# Patient Record
Sex: Female | Born: 1975 | Race: Black or African American | Hispanic: No | Marital: Married | Smoking: Never smoker
Health system: Southern US, Community
[De-identification: ages and names within clinical notes are randomized; demographics above are authoritative.]

## PROBLEM LIST (undated history)

## (undated) DIAGNOSIS — K810 Acute cholecystitis: Secondary | ICD-10-CM

## (undated) DIAGNOSIS — K819 Cholecystitis, unspecified: Secondary | ICD-10-CM

## (undated) DIAGNOSIS — M549 Dorsalgia, unspecified: Secondary | ICD-10-CM

## (undated) DIAGNOSIS — R740 Nonspecific elevation of levels of transaminase and lactic acid dehydrogenase [LDH]: Secondary | ICD-10-CM

## (undated) DIAGNOSIS — I10 Essential (primary) hypertension: Secondary | ICD-10-CM

## (undated) HISTORY — DX: Cholecystitis, unspecified: K81.9

---

## 2013-02-13 ENCOUNTER — Emergency Department (HOSPITAL_BASED_OUTPATIENT_CLINIC_OR_DEPARTMENT_OTHER): Payer: BC Managed Care – PPO

## 2013-02-13 ENCOUNTER — Inpatient Hospital Stay (HOSPITAL_COMMUNITY): Payer: BC Managed Care – PPO

## 2013-02-13 ENCOUNTER — Encounter (HOSPITAL_COMMUNITY): Admission: EM | Disposition: A | Discharge status: Home / Self Care | Payer: Self-pay | Source: Home / Self Care

## 2013-02-13 ENCOUNTER — Encounter (HOSPITAL_COMMUNITY): Payer: Self-pay | Admitting: Anesthesiology

## 2013-02-13 ENCOUNTER — Encounter (HOSPITAL_BASED_OUTPATIENT_CLINIC_OR_DEPARTMENT_OTHER): Payer: Self-pay | Admitting: Emergency Medicine

## 2013-02-13 ENCOUNTER — Inpatient Hospital Stay (HOSPITAL_COMMUNITY): Payer: BC Managed Care – PPO | Admitting: Anesthesiology

## 2013-02-13 ENCOUNTER — Inpatient Hospital Stay (HOSPITAL_BASED_OUTPATIENT_CLINIC_OR_DEPARTMENT_OTHER)
Admission: EM | Admit: 2013-02-13 | Discharge: 2013-02-15 | DRG: 494 | Disposition: A | Discharge status: Home / Self Care | Payer: BC Managed Care – PPO | Attending: General Surgery | Admitting: General Surgery

## 2013-02-13 DIAGNOSIS — I10 Essential (primary) hypertension: Secondary | ICD-10-CM | POA: Diagnosis present

## 2013-02-13 DIAGNOSIS — K8000 Calculus of gallbladder with acute cholecystitis without obstruction: Principal | ICD-10-CM | POA: Diagnosis present

## 2013-02-13 DIAGNOSIS — K810 Acute cholecystitis: Secondary | ICD-10-CM | POA: Diagnosis present

## 2013-02-13 DIAGNOSIS — R945 Abnormal results of liver function studies: Secondary | ICD-10-CM

## 2013-02-13 DIAGNOSIS — K8010 Calculus of gallbladder with chronic cholecystitis without obstruction: Secondary | ICD-10-CM

## 2013-02-13 DIAGNOSIS — R7989 Other specified abnormal findings of blood chemistry: Secondary | ICD-10-CM | POA: Diagnosis present

## 2013-02-13 HISTORY — DX: Essential (primary) hypertension: I10

## 2013-02-13 HISTORY — DX: Dorsalgia, unspecified: M54.9

## 2013-02-13 HISTORY — PX: CHOLECYSTECTOMY: SHX55

## 2013-02-13 HISTORY — DX: Nonspecific elevation of levels of transaminase and lactic acid dehydrogenase (ldh): R74.0

## 2013-02-13 HISTORY — PX: LIVER BIOPSY: SHX301

## 2013-02-13 HISTORY — DX: Acute cholecystitis: K81.0

## 2013-02-13 LAB — CBC WITH DIFFERENTIAL/PLATELET
Basophils Absolute: 0 10*3/uL (ref 0.0–0.1)
Basophils Relative: 0 % (ref 0–1)
Eosinophils Absolute: 0 10*3/uL (ref 0.0–0.7)
Hemoglobin: 12.9 g/dL (ref 12.0–15.0)
Lymphocytes Relative: 16 % (ref 12–46)
MCH: 23.1 pg — ABNORMAL LOW (ref 26.0–34.0)
MCHC: 33.2 g/dL (ref 30.0–36.0)
Monocytes Absolute: 0.6 10*3/uL (ref 0.1–1.0)
Neutrophils Relative %: 75 % (ref 43–77)
Platelets: 277 10*3/uL (ref 150–400)
RDW: 14.3 % (ref 11.5–15.5)

## 2013-02-13 LAB — URINALYSIS, ROUTINE W REFLEX MICROSCOPIC
Bilirubin Urine: NEGATIVE
Glucose, UA: NEGATIVE mg/dL
Hgb urine dipstick: NEGATIVE
Protein, ur: NEGATIVE mg/dL
Specific Gravity, Urine: 1.014 (ref 1.005–1.030)
Urobilinogen, UA: 0.2 mg/dL (ref 0.0–1.0)

## 2013-02-13 LAB — COMPREHENSIVE METABOLIC PANEL
Alkaline Phosphatase: 86 U/L (ref 39–117)
BUN: 11 mg/dL (ref 6–23)
CO2: 24 mEq/L (ref 19–32)
GFR calc Af Amer: >90 mL/min (ref >90)
GFR calc non Af Amer: >90 mL/min (ref >90)
Glucose, Bld: 115 mg/dL — ABNORMAL HIGH (ref 70–99)
Potassium: 4 mEq/L (ref 3.5–5.1)
Total Protein: 7.6 g/dL (ref 6.0–8.3)

## 2013-02-13 LAB — LIPASE, BLOOD: Lipase: 40 U/L (ref 11–59)

## 2013-02-13 SURGERY — LAPAROSCOPIC CHOLECYSTECTOMY WITH INTRAOPERATIVE CHOLANGIOGRAM
Anesthesia: General | Wound class: Clean Contaminated

## 2013-02-13 MED ORDER — SODIUM CHLORIDE 0.9 % IV SOLN
3.0000 g | Freq: Four times a day (QID) | INTRAVENOUS | Status: AC
  Administered 2013-02-13: 3 g via INTRAVENOUS
  Filled 2013-02-13 (×2): qty 3

## 2013-02-13 MED ORDER — HYDROMORPHONE HCL PF 1 MG/ML IJ SOLN
INTRAMUSCULAR | Status: DC | PRN
  Administered 2013-02-13 (×2): 0.5 mg via INTRAVENOUS

## 2013-02-13 MED ORDER — NEOSTIGMINE METHYLSULFATE 1 MG/ML IJ SOLN
INTRAMUSCULAR | Status: DC | PRN
  Administered 2013-02-13: 4 mg via INTRAVENOUS

## 2013-02-13 MED ORDER — ONDANSETRON HCL 4 MG/2ML IJ SOLN
INTRAMUSCULAR | Status: DC | PRN
  Administered 2013-02-13: 4 mg via INTRAVENOUS

## 2013-02-13 MED ORDER — HYDROMORPHONE HCL PF 1 MG/ML IJ SOLN
0.5000 mg | INTRAMUSCULAR | Status: DC | PRN
  Administered 2013-02-13: 1 mg via INTRAVENOUS
  Filled 2013-02-13 (×2): qty 1

## 2013-02-13 MED ORDER — HEMOSTATIC AGENTS (NO CHARGE) OPTIME
TOPICAL | Status: DC | PRN
  Administered 2013-02-13: 1 via TOPICAL

## 2013-02-13 MED ORDER — FENTANYL CITRATE 0.05 MG/ML IJ SOLN
50.0000 ug | Freq: Once | INTRAMUSCULAR | Status: AC
  Administered 2013-02-13: 50 ug via INTRAVENOUS
  Filled 2013-02-13: qty 2

## 2013-02-13 MED ORDER — IOHEXOL 300 MG/ML  SOLN
100.0000 mL | Freq: Once | INTRAMUSCULAR | Status: AC | PRN
  Administered 2013-02-13: 100 mL via INTRAVENOUS

## 2013-02-13 MED ORDER — ROCURONIUM BROMIDE 100 MG/10ML IV SOLN
INTRAVENOUS | Status: DC | PRN
  Administered 2013-02-13: 10 mg via INTRAVENOUS
  Administered 2013-02-13: 30 mg via INTRAVENOUS
  Administered 2013-02-13: 10 mg via INTRAVENOUS

## 2013-02-13 MED ORDER — IOHEXOL 300 MG/ML  SOLN
INTRAMUSCULAR | Status: DC | PRN
  Administered 2013-02-13: 50 mL

## 2013-02-13 MED ORDER — GLYCOPYRROLATE 0.2 MG/ML IJ SOLN
INTRAMUSCULAR | Status: DC | PRN
  Administered 2013-02-13: .7 mg via INTRAVENOUS

## 2013-02-13 MED ORDER — LACTATED RINGERS IV SOLN
INTRAVENOUS | Status: DC
  Administered 2013-02-13: 1000 mL via INTRAVENOUS

## 2013-02-13 MED ORDER — MIDAZOLAM HCL 5 MG/5ML IJ SOLN
INTRAMUSCULAR | Status: DC | PRN
  Administered 2013-02-13: 2 mg via INTRAVENOUS

## 2013-02-13 MED ORDER — 0.9 % SODIUM CHLORIDE (POUR BTL) OPTIME
TOPICAL | Status: DC | PRN
  Administered 2013-02-13: 1000 mL

## 2013-02-13 MED ORDER — PROMETHAZINE HCL 25 MG/ML IJ SOLN: 6.2500 mg | INTRAMUSCULAR | Status: DC | PRN

## 2013-02-13 MED ORDER — LIDOCAINE HCL (CARDIAC) 20 MG/ML IV SOLN
INTRAVENOUS | Status: DC | PRN
  Administered 2013-02-13: 50 mg via INTRAVENOUS

## 2013-02-13 MED ORDER — PANTOPRAZOLE SODIUM 40 MG IV SOLR
40.0000 mg | Freq: Once | INTRAVENOUS | Status: AC
  Administered 2013-02-13: 40 mg via INTRAVENOUS
  Filled 2013-02-13: qty 40

## 2013-02-13 MED ORDER — PIPERACILLIN-TAZOBACTAM 3.375 G IVPB
3.3750 g | Freq: Three times a day (TID) | INTRAVENOUS | Status: DC
  Filled 2013-02-13 (×2): qty 50

## 2013-02-13 MED ORDER — KETOROLAC TROMETHAMINE 30 MG/ML IJ SOLN
15.0000 mg | Freq: Once | INTRAMUSCULAR | Status: AC | PRN
  Administered 2013-02-13: 30 mg via INTRAVENOUS

## 2013-02-13 MED ORDER — LABETALOL HCL 5 MG/ML IV SOLN
INTRAVENOUS | Status: DC | PRN
  Administered 2013-02-13 (×2): 5 mg via INTRAVENOUS

## 2013-02-13 MED ORDER — DIPHENHYDRAMINE HCL 12.5 MG/5ML PO ELIX: 12.5000 mg | ORAL_SOLUTION | Freq: Four times a day (QID) | ORAL | Status: DC | PRN

## 2013-02-13 MED ORDER — PROMETHAZINE HCL 25 MG PO TABS: 25.0000 mg | ORAL_TABLET | Freq: Four times a day (QID) | ORAL | Status: DC | PRN

## 2013-02-13 MED ORDER — PROMETHAZINE HCL 25 MG RE SUPP: 25.0000 mg | Freq: Four times a day (QID) | RECTAL | Status: DC | PRN

## 2013-02-13 MED ORDER — SODIUM CHLORIDE 0.9 % IV SOLN
Freq: Once | INTRAVENOUS | Status: AC
  Administered 2013-02-13: 04:00:00 via INTRAVENOUS

## 2013-02-13 MED ORDER — IOHEXOL 300 MG/ML  SOLN
50.0000 mL | Freq: Once | INTRAMUSCULAR | Status: AC | PRN
  Administered 2013-02-13: 50 mL via ORAL

## 2013-02-13 MED ORDER — PROPOFOL 10 MG/ML IV BOLUS
INTRAVENOUS | Status: DC | PRN
  Administered 2013-02-13: 150 mg via INTRAVENOUS

## 2013-02-13 MED ORDER — BUPIVACAINE HCL 0.5 % IJ SOLN
INTRAMUSCULAR | Status: DC | PRN
  Administered 2013-02-13: 10 mL

## 2013-02-13 MED ORDER — FENTANYL CITRATE 0.05 MG/ML IJ SOLN
INTRAMUSCULAR | Status: DC | PRN
  Administered 2013-02-13: 100 ug via INTRAVENOUS
  Administered 2013-02-13 (×3): 50 ug via INTRAVENOUS

## 2013-02-13 MED ORDER — DEXAMETHASONE SODIUM PHOSPHATE 10 MG/ML IJ SOLN
INTRAMUSCULAR | Status: DC | PRN
  Administered 2013-02-13: 10 mg via INTRAVENOUS

## 2013-02-13 MED ORDER — OXYCODONE HCL 5 MG PO TABS
5.0000 mg | ORAL_TABLET | ORAL | Status: DC | PRN
  Administered 2013-02-14 (×4): 10 mg via ORAL
  Administered 2013-02-14: 5 mg via ORAL
  Administered 2013-02-14 – 2013-02-15 (×2): 10 mg via ORAL
  Filled 2013-02-13: qty 1
  Filled 2013-02-13 (×6): qty 2

## 2013-02-13 MED ORDER — PROMETHAZINE HCL 25 MG/ML IJ SOLN: 25.0000 mg | Freq: Four times a day (QID) | INTRAMUSCULAR | Status: DC | PRN

## 2013-02-13 MED ORDER — DIPHENHYDRAMINE HCL 50 MG/ML IJ SOLN: 12.5000 mg | Freq: Four times a day (QID) | INTRAMUSCULAR | Status: DC | PRN

## 2013-02-13 MED ORDER — ONDANSETRON HCL 4 MG/2ML IJ SOLN: 4.0000 mg | Freq: Four times a day (QID) | INTRAMUSCULAR | Status: DC | PRN

## 2013-02-13 MED ORDER — GI COCKTAIL ~~LOC~~
30.0000 mL | Freq: Once | ORAL | Status: AC
  Administered 2013-02-13: 30 mL via ORAL
  Filled 2013-02-13: qty 30

## 2013-02-13 MED ORDER — HYDROMORPHONE HCL PF 1 MG/ML IJ SOLN: 0.2500 mg | INTRAMUSCULAR | Status: DC | PRN

## 2013-02-13 MED ORDER — POTASSIUM CHLORIDE IN NACL 20-0.9 MEQ/L-% IV SOLN
INTRAVENOUS | Status: DC
  Administered 2013-02-13 – 2013-02-15 (×4): via INTRAVENOUS
  Filled 2013-02-13 (×5): qty 1000

## 2013-02-13 MED ORDER — SODIUM CHLORIDE 0.9 % IV SOLN
3.0000 g | Freq: Once | INTRAVENOUS | Status: AC
  Administered 2013-02-13: 3 g via INTRAVENOUS
  Filled 2013-02-13: qty 3

## 2013-02-13 MED ORDER — LACTATED RINGERS IV SOLN
INTRAVENOUS | Status: DC | PRN
  Administered 2013-02-13: 1000 mL

## 2013-02-13 SURGICAL SUPPLY — 45 items
APPLIER CLIP 5 13 M/L LIGAMAX5 (MISCELLANEOUS) ×3
APPLIER CLIP ROT 10 11.4 M/L (STAPLE)
BENZOIN TINCTURE PRP APPL 2/3 (GAUZE/BANDAGES/DRESSINGS) ×3 IMPLANT
CANISTER SUCTION 2500CC (MISCELLANEOUS) ×3 IMPLANT
CHLORAPREP W/TINT 26ML (MISCELLANEOUS) ×3 IMPLANT
CLIP APPLIE 5 13 M/L LIGAMAX5 (MISCELLANEOUS) ×2 IMPLANT
CLIP APPLIE ROT 10 11.4 M/L (STAPLE) IMPLANT
CLOTH BEACON ORANGE TIMEOUT ST (SAFETY) ×3 IMPLANT
COVER MAYO STAND STRL (DRAPES) IMPLANT
COVER SURGICAL LIGHT HANDLE (MISCELLANEOUS) IMPLANT
DECANTER SPIKE VIAL GLASS SM (MISCELLANEOUS) ×3 IMPLANT
DRAPE C-ARM 42X72 X-RAY (DRAPES) IMPLANT
DRAPE LAPAROSCOPIC ABDOMINAL (DRAPES) ×3 IMPLANT
DRAPE UTILITY XL STRL (DRAPES) ×3 IMPLANT
DRSG TEGADERM 2-3/8X2-3/4 SM (GAUZE/BANDAGES/DRESSINGS) ×3 IMPLANT
ELECT REM PT RETURN 9FT ADLT (ELECTROSURGICAL) ×3
ELECTRODE REM PT RTRN 9FT ADLT (ELECTROSURGICAL) ×2 IMPLANT
ENDOLOOP SUT PDS II  0 18 (SUTURE)
ENDOLOOP SUT PDS II 0 18 (SUTURE) IMPLANT
GAUZE SPONGE 2X2 8PLY STRL LF (GAUZE/BANDAGES/DRESSINGS) ×2 IMPLANT
GLOVE BIOGEL PI IND STRL 7.0 (GLOVE) ×2 IMPLANT
GLOVE BIOGEL PI INDICATOR 7.0 (GLOVE) ×1
GLOVE ECLIPSE 8.0 STRL XLNG CF (GLOVE) ×3 IMPLANT
GLOVE INDICATOR 8.0 STRL GRN (GLOVE) ×6 IMPLANT
GOWN STRL NON-REIN LRG LVL3 (GOWN DISPOSABLE) ×3 IMPLANT
GOWN STRL REIN XL XLG (GOWN DISPOSABLE) ×6 IMPLANT
HEMOSTAT SNOW SURGICEL 2X4 (HEMOSTASIS) ×3 IMPLANT
HEMOSTAT SURGICEL 4X8 (HEMOSTASIS) IMPLANT
IV CATH 14GX2 1/4 (CATHETERS) IMPLANT
KIT BASIN OR (CUSTOM PROCEDURE TRAY) ×3 IMPLANT
NS IRRIG 1000ML POUR BTL (IV SOLUTION) IMPLANT
POUCH SPECIMEN RETRIEVAL 10MM (ENDOMECHANICALS) IMPLANT
SCISSORS LAP 5X35 DISP (ENDOMECHANICALS) ×3 IMPLANT
SET CHOLANGIOGRAPH MIX (MISCELLANEOUS) ×3 IMPLANT
SET IRRIG TUBING LAPAROSCOPIC (IRRIGATION / IRRIGATOR) ×3 IMPLANT
SOLUTION ANTI FOG 6CC (MISCELLANEOUS) ×3 IMPLANT
SPONGE GAUZE 2X2 STER 10/PKG (GAUZE/BANDAGES/DRESSINGS) ×1
STRIP CLOSURE SKIN 1/2X4 (GAUZE/BANDAGES/DRESSINGS) ×3 IMPLANT
SUT MNCRL AB 4-0 PS2 18 (SUTURE) ×3 IMPLANT
TOWEL OR 17X26 10 PK STRL BLUE (TOWEL DISPOSABLE) ×3 IMPLANT
TRAY LAP CHOLE (CUSTOM PROCEDURE TRAY) ×3 IMPLANT
TROCAR BLADELESS OPT 5 75 (ENDOMECHANICALS) ×9 IMPLANT
TROCAR XCEL BLUNT TIP 100MML (ENDOMECHANICALS) ×3 IMPLANT
TROCAR XCEL NON-BLD 11X100MML (ENDOMECHANICALS) IMPLANT
TUBING INSUFFLATION 10FT LAP (TUBING) ×3 IMPLANT

## 2013-02-13 NOTE — ED Notes (Signed)
Unasyn medication stopped and flushed from line with 10 mL normal saline before administration of Zofran. Zofran flushed from the line with 10 mL normal saline before Unasyn was restarted.

## 2013-02-13 NOTE — Anesthesia Postprocedure Evaluation (Signed)
  Anesthesia Post-op Note  Patient: Stacy Harrell  Procedure(s) Performed: Procedure(s): LAPAROSCOPIC CHOLECYSTECTOMY WITH INTRAOPERATIVE CHOLANGIOGRAM (N/A) LIVER BIOPSY  Patient Location: PACU  Anesthesia Type:General  Level of Consciousness: awake, alert  and oriented  Airway and Oxygen Therapy: Patient Spontanous Breathing and Patient connected to nasal cannula oxygen  Post-op Pain: none  Post-op Assessment: Post-op Vital signs reviewed, Patient's Cardiovascular Status Stable, Respiratory Function Stable, Patent Airway, No signs of Nausea or vomiting and Pain level controlled  Post-op Vital Signs: Reviewed and stable  Complications: No apparent anesthesia complications

## 2013-02-13 NOTE — Anesthesia Preprocedure Evaluation (Signed)
Anesthesia Evaluation  Patient identified by MRN, date of birth, ID band Patient awake    Reviewed: Allergy & Precautions, H&P , NPO status , Patient's Chart, lab work & pertinent test results  Airway Mallampati: II  TM Distance: >3 FB Neck ROM: Full    Dental no notable dental hx.    Pulmonary neg pulmonary ROS,  breath sounds clear to auscultation  Pulmonary exam normal       Cardiovascular hypertension, Pt. on medications Rhythm:Regular Rate:Normal     Neuro/Psych negative neurological ROS  negative psych ROS   GI/Hepatic negative GI ROS, Neg liver ROS,   Endo/Other  negative endocrine ROS  Renal/GU negative Renal ROS  negative genitourinary   Musculoskeletal negative musculoskeletal ROS (+)   Abdominal   Peds negative pediatric ROS (+)  Hematology negative hematology ROS (+)   Anesthesia Other Findings   Reproductive/Obstetrics negative OB ROS                            Anesthesia Physical Anesthesia Plan  ASA: II  Anesthesia Plan: General   Post-op Pain Management:    Induction: Intravenous  Airway Management Planned: Oral ETT  Additional Equipment:   Intra-op Plan:   Post-operative Plan: Extubation in OR  Informed Consent: I have reviewed the patients History and Physical, chart, labs and discussed the procedure including the risks, benefits and alternatives for the proposed anesthesia with the patient or authorized representative who has indicated his/her understanding and acceptance.   Dental advisory given  Plan Discussed with: CRNA and Surgeon  Anesthesia Plan Comments:        Anesthesia Quick Evaluation  

## 2013-02-13 NOTE — ED Provider Notes (Addendum)
History     CSN: [id number]  Arrival date & time 02/13/13  0028   First MD Initiated Contact with Patient 02/13/13 [phone]      Chief Complaint  Patient presents with  . Abdominal Pain    (Consider location/radiation/quality/duration/timing/severity/associated sxs/prior treatment) HPI This is a 37 year old female who developed subxiphoid pain at about 11 PM yesterday evening as she was preparing for bed. She states the pain feels like a tightness as if a band or rope as being tight around her. It radiates to the left into the right around to the back. It is moderate to severe. It is worse with palpation. It is not worse with deep breathing. She is not short of breath. She is not nauseated or having vomiting or diarrhea. She denies a history of abdominal surgery. She denies a history of abdominal trauma.  Past Medical History  Diagnosis Date  . Back pain     History reviewed. No pertinent past surgical history.  No family history on file.  History  Substance Use Topics  . Smoking status: Never Smoker   . Smokeless tobacco: Not on file  . Alcohol Use: No    OB History   Grav Para Term Preterm Abortions TAB SAB Ect Mult Living                  Review of Systems  All other systems reviewed and are negative.    Allergies  Review of patient's allergies indicates no known allergies.  Home Medications   Current Outpatient Rx  Name  Route  Sig  Dispense  Refill  . methocarbamol (ROBAXIN) 500 MG tablet   Oral   Take 500 mg by mouth 4 (four) times daily.         Marland Kitchen NIFEdipine (PROCARDIA-XL/ADALAT-CC/NIFEDICAL-XL) 30 MG 24 hr tablet   Oral   Take 30 mg by mouth daily.           BP 152/86  Pulse 74  Temp(Src) 98.1 F (36.7 C) (Oral)  Resp 18  SpO2 100%  LMP 01/18/2013  Physical Exam General: Well-developed, well-nourished female in no acute distress; appearance consistent with age of record HENT: normocephalic, atraumatic Eyes: pupils equal round and  reactive to light; extraocular muscles intact Neck: supple Heart: regular rate and rhythm; no murmurs, rubs or gallops Lungs: clear to auscultation bilaterally Chest: Xiphoid tenderness Abdomen: soft; nondistended; mild epigastric and right upper quadrant tenderness; no masses or hepatosplenomegaly; bowel sounds present; gallstones seen on bedside ultrasound Extremities: No deformity; full range of motion Neurologic: Awake, alert; motor function intact in all extremities and symmetric; no facial droop Skin: Warm and dry Psychiatric: Tearful    ED Course  Procedures (including critical care time)    MDM   Nursing notes and vitals signs, including pulse oximetry, reviewed.  Summary of this visit's results, reviewed by myself:  Labs:  Results for orders placed during the hospital encounter of 02/13/13 (from the past 24 hour(s))  PREGNANCY, URINE     Status: None   Collection Time    02/13/13 12:41 AM      Result Value Range   Preg Test, Ur NEGATIVE  NEGATIVE  URINALYSIS, ROUTINE W REFLEX MICROSCOPIC     Status: None   Collection Time    02/13/13 12:41 AM      Result Value Range   Color, Urine YELLOW  YELLOW   APPearance CLEAR  CLEAR   Specific Gravity, Urine 1.014  1.005 - 1.030   pH 6.0  5.0 - 8.0   Glucose, UA NEGATIVE  NEGATIVE mg/dL   Hgb urine dipstick NEGATIVE  NEGATIVE   Bilirubin Urine NEGATIVE  NEGATIVE   Ketones, ur NEGATIVE  NEGATIVE mg/dL   Protein, ur NEGATIVE  NEGATIVE mg/dL   Urobilinogen, UA 0.2  0.0 - 1.0 mg/dL   Nitrite NEGATIVE  NEGATIVE   Leukocytes, UA NEGATIVE  NEGATIVE  COMPREHENSIVE METABOLIC PANEL     Status: Abnormal   Collection Time    02/13/13  3:47 AM      Result Value Range   Sodium 138  135 - 145 mEq/L   Potassium 4.0  3.5 - 5.1 mEq/L   Chloride 103  96 - 112 mEq/L   CO2 24  19 - 32 mEq/L   Glucose, Bld 115 (*) 70 - 99 mg/dL   BUN 11  6 - 23 mg/dL   Creatinine, Ser 4.09  0.50 - 1.10 mg/dL   Calcium 9.2  8.4 - 81.1 mg/dL   Total  Protein 7.6  6.0 - 8.3 g/dL   Albumin 3.7  3.5 - 5.2 g/dL   AST 914 (*) 0 - 37 U/L   ALT 338 (*) 0 - 35 U/L   Alkaline Phosphatase 86  39 - 117 U/L   Total Bilirubin 0.6  0.3 - 1.2 mg/dL   GFR calc non Af Amer >90  >90 mL/min   GFR calc Af Amer >90  >90 mL/min  LIPASE, BLOOD     Status: None   Collection Time    02/13/13  3:47 AM      Result Value Range   Lipase 40  11 - 59 U/L  CBC WITH DIFFERENTIAL     Status: Abnormal   Collection Time    02/13/13  3:47 AM      Result Value Range   WBC 6.3  4.0 - 10.5 K/uL   RBC 5.59 (*) 3.87 - 5.11 MIL/uL   Hemoglobin 12.9  12.0 - 15.0 g/dL   HCT 78.2  95.6 - 21.3 %   MCV 69.4 (*) 78.0 - 100.0 fL   MCH 23.1 (*) 26.0 - 34.0 pg   MCHC 33.2  30.0 - 36.0 g/dL   RDW 08.6  57.8 - 46.9 %   Platelets 277  150 - 400 K/uL   Neutrophils Relative 75  43 - 77 %   Lymphocytes Relative 16  12 - 46 %   Monocytes Relative 9  3 - 12 %   Eosinophils Relative 0  0 - 5 %   Basophils Relative 0  0 - 1 %   Neutro Abs 4.7  1.7 - 7.7 K/uL   Lymphs Abs 1.0  0.7 - 4.0 K/uL   Monocytes Absolute 0.6  0.1 - 1.0 K/uL   Eosinophils Absolute 0.0  0.0 - 0.7 K/uL   Basophils Absolute 0.0  0.0 - 0.1 K/uL   RBC Morphology ELLIPTOCYTES     WBC Morphology WHITE COUNT CONFIRMED ON SMEAR     Smear Review PLATELET COUNT CONFIRMED BY SMEAR      Imaging Studies: Dg Chest 2 View  02/13/2013  *RADIOLOGY REPORT*  Clinical Data: Mid chest pain and epigastric pain; shortness of breath.  CHEST - 2 VIEW  Comparison: None.  Findings: The lungs are well-aerated and clear.  There is no evidence of focal opacification, pleural effusion or pneumothorax.  The heart is borderline normal in size; the mediastinal contour is within normal limits.  No acute osseous abnormalities are seen.  IMPRESSION: No acute cardiopulmonary process seen.   Original Report Authenticated By: Tonia Ghent, M.D.    US Abdomen Complete  02/13/2013  *RADIOLOGY REPORT*  Clinical Data:  Chest pain and abnormal  gallbladder on recent abdominal CT.  COMPLETE ABDOMINAL ULTRASOUND  Comparison:  Abdominal CT 02/13/2013  Findings:  Gallbladder:  There are multiple echogenic stones in the gallbladder.  The gallbladder wall is thickened, measuring up to 9 mm and there may be edema within the gallbladder wall.  The patient does have a sonographic Murphy's sign.  Many of the echogenic gallstones demonstrate posterior acoustic shadowing.  Common bile duct:  Common bile duct measures 0.3 cm.  Liver:  No focal lesion identified.  Within normal limits in parenchymal echogenicity.  IVC:  Appears normal.  Pancreas:  Pancreatic tail is not visualized due to bowel gas.  Spleen:  Spleen length is 6.2 cm.  No gross abnormality to the spleen.  Right Kidney:  Right kidney is difficult to evaluate due to shadowing.  No evidence for hydronephrosis.  Left Kidney:  Left kidney measures 10.1 cm in length without hydronephrosis.  Abdominal aorta:  The distal and mid abdominal aorta are difficult to evaluate due to bowel gas.  IMPRESSION: Cholelithiasis with gallbladder wall thickening and a sonographic Murphy's sign.  Findings are suggestive for acute cholecystitis.  No evidence for biliary dilatation.  Many of the intra-abdominal structures are poorly visualized due to bowel gas.   Original Report Authenticated By: Richarda Overlie, M.D.    Ct Abdomen Pelvis W Contrast  02/13/2013  *RADIOLOGY REPORT*  Clinical Data: Epigastric abdominal pain.  CT ABDOMEN AND PELVIS WITH CONTRAST  Technique:  Multidetector CT imaging of the abdomen and pelvis was performed following the standard protocol during bolus administration of intravenous contrast.  Contrast: 100 mL of Omnipaque 300 IV contrast  Comparison: None.  Findings: The visualized lung bases are clear.  There is mild soft tissue inflammation about the gallbladder, with mild apparent gallbladder wall thickening.  Mildly increased attenuation layering within the gallbladder may reflect sludge.  The liver is  unremarkable in appearance.  A small amount of high- attenuation fluid is noted surrounding the spleen, which could reflect trace blood.  Would correlate for evidence of recent traumatic injury.  The pancreas and adrenal glands are unremarkable in appearance.  The kidneys are unremarkable in appearance.  There is no evidence of hydronephrosis.  No renal or ureteral stones are seen.  No perinephric stranding is appreciated.  No free fluid is identified.  The small bowel is unremarkable in appearance.  The stomach is within normal limits.  No acute vascular abnormalities are seen.  The appendix is normal in caliber and contains air, without evidence for appendicitis.  The colon is unremarkable in appearance.  The bladder is moderately distended and grossly unremarkable in appearance.  The uterus is grossly unremarkable, though difficult to fully assess.  The ovaries are relatively symmetric; no suspicious adnexal masses are seen.  No inguinal lymphadenopathy is seen.  No acute osseous abnormalities are identified.  IMPRESSION:  1.  Mild soft tissue inflammation about the gallbladder, with mild apparent gallbladder wall thickening.  Likely sludge noted within the gallbladder.  Would correlate for evidence of cholecystitis, and consider further imaging as deemed clinically appropriate. 2.  Small amount of high-attenuation fluid noted surrounding the spleen; this is suspicious for trace blood.  Would correlate for evidence of recent traumatic injury.  This could be relatively chronic in nature.  These results were called by  telephone on 02/13/2013 at 07:07 a.m. to Dr. Brock Bad, who verbally acknowledged these results.   Original Report Authenticated By: Tonia Ghent, M.D.     EKG Interpretation:  Date & Time: 02/13/2013 3:52 AM  Rate: 74  Rhythm: normal sinus rhythm  QRS Axis: normal  Intervals: normal  ST/T Wave abnormalities: normal  Conduction Disutrbances:none  Narrative Interpretation: [facility]  Old EKG  Reviewed: none available  5:29 AM Some improvement with GI cocktail. Patient is still having significant xiphoid discomfort as well as right upper quadrant tenderness. Her transaminases are elevated. We will obtain a CT scan for further evaluation.  7:11 AM Patient feels better after fentanyl IV. In light of the patient's CT abnormalities and elevated transaminases we will obtain an ultrasound of the abdomen. Dr. Ranae Palms will follow up on this and make disposition.         Hanley Seamen, MD 02/13/13 1610  Hanley Seamen, MD 02/13/13 [phone]

## 2013-02-13 NOTE — ED Provider Notes (Signed)
U/s demonstrating acute chole. Discussed with Dr Abbey Chatters who will accept transfer to [facility]. Asks that Unasyn 3 grams be given.   Loren Racer, MD 02/13/13 [phone]

## 2013-02-13 NOTE — ED Notes (Signed)
Pt c/o epigastric pain at 2300 tonight. Denies n/v/d.

## 2013-02-13 NOTE — Op Note (Signed)
Preoperative diagnosis:   Acute cholecystitis  Postoperative diagnosis:  Same  Procedure: Laparoscopic cholecystectomy with cholangiogram.  Wedge liver biopsy.  Surgeon: Avel Peace, M.D.  Asst.:  None  Anesthesia: General  Indication:   This is a 37 year old female who developed epigastric and right upper quadrant pain that radiated toward her back along with nausea area the pain persisted. She was evaluated and noted to have gallstones with a thickened gallbladder wall and a positive Murphy sign. Her SGOT and SGPT were significantly elevated. She had a normal white blood cell count. She is brought to the operating room for cholecystectomy.  Technique: She was brought to the operating room, placed supine on the operating table, and a general anesthetic was administered. The hair on the abdominal wall was clipped as was necessary. The abdominal wall was then sterilely prepped and draped. Local anesthetic (Marcaine) was infiltrated in the subumbilical region. A small subumbilical incision was made through the skin, subcutaneous tissue, fascia, and peritoneum entering the peritoneal cavity under direct vision. A pursestring suture of 0 Vicryl was placed around the edges of the fascia. A Hassan trocar was introduced into the peritoneal cavity and a pneumoperitoneum was created by insufflation of carbon dioxide gas. The laparoscope was introduced into the trocar and no underlying bleeding or organ injury was noted. The patient was then placed in the reverse Trendelenburg position with the right side tilted slightly up.  Three 5 mm trocars were then placed into the abdominal cavity under laparoscopic vision. One in the epigastric area, and 2 in the right upper quadrant area. The gallbladder was visualized and the fundus was grasped and retracted toward the right shoulder. Mild to moderate acute inflammatory changes were noted. Significant gallbladder wall edema was noted. The infundibulum was  mobilized with dissection close to the gallbladder and retracted laterally. The cystic duct was identified and a window was created around it. The anterior branch of the cystic artery was also identified and a window was created around it. The critical view was achieved. A clip was placed at the neck of the gallbladder. A small incision was made in the cystic duct. A cholangiocatheter was introduced through the anterior abdominal wall and placed in the cystic duct. A intraoperative cholangiogram was then performed.  Under real-time fluoroscopy, dilute contrast was injected into the cystic duct.  The common hepatic duct, the right and left hepatic ducts, and the common duct were all visualized. Contrast drained into the duodenum without obvious evidence of any obstructing ductal lesion. The final report is pending the Radiologist's interpretation.  The cholangiocatheter was removed, the cystic duct was clipped 3 times on the biliary side, and then the cystic duct was divided sharply. No bile leak was noted from the cystic duct stump.  The anterior branch of the cystic artery was then clipped and divided.  The posterior branch of the cystic artery was identified, clipped, and divided.  Following this the gallbladder was dissected free from the liver using electrocautery.  Because of the findings during surgery and cholangiography did not completely explain the elevation of the liver function tests in my mind, a wedge liver biopsy was performed.   The gallbladder and liver biopsy specimen were then placed in a retrieval bag and removed from the abdominal cavity through the subumbilical incision.  The gallbladder fossa was inspected, irrigated, and bleeding was controlled with electrocautery. Inspection showed that hemostasis was adequate and there was no evidence of bile leak. A piece of Surgicel was placed in the  gallbladder fossa. The irrigation fluid was evacuated as much as possible.  The subumbilical  trocar was removed and the fascial defect was closed by tightening and tying down the pursestring suture under laparoscopic vision.  The remaining trocars were removed and the pneumoperitoneum was released. The skin incisions were closed with 4-0 Monocryl subcuticular stitches. Steri-Strips and sterile dressings were applied.  The procedure was well-tolerated without any apparent complications. The patient was taken to the recovery room in satisfactory condition.

## 2013-02-13 NOTE — Transfer of Care (Signed)
Immediate Anesthesia Transfer of Care Note  Patient: Stacy Harrell  Procedure(s) Performed: Procedure(s) (LRB): LAPAROSCOPIC CHOLECYSTECTOMY WITH INTRAOPERATIVE CHOLANGIOGRAM (N/A) LIVER BIOPSY  Patient Location: PACU  Anesthesia Type: General  Level of Consciousness: sedated, patient cooperative and responds to stimulaton  Airway & Oxygen Therapy: Patient Spontanous Breathing and Patient connected to face mask oxgen  Post-op Assessment: Report given to PACU RN and Post -op Vital signs reviewed and stable  Post vital signs: Reviewed and stable  Complications: No apparent anesthesia complications

## 2013-02-13 NOTE — H&P (Signed)
Patient seen and examined.  Agree with PA's note. Plan cholecystectomy today.  Procedure and risks discussed with her.  Questions answered.

## 2013-02-13 NOTE — H&P (Signed)
Stacy Harrell is an 37 y.o. female.   Chief Complaint: Abdominal Pain Req: Dr. Ranae Palms, ER HPI: 37 year old female who developed subxiphoid pain at about 11 PM yesterday evening as she was preparing for bed. She states the pain feels like a tightness as if a band or rope as being tight around her. It radiates to the left into the right around to the back.  It is severe at times.  She is not short of breath. She denies nausea, vomiting, diarrhea or a history of abdominal surgery.   Past Medical History  Diagnosis Date  . Back pain   . Hypertension     History reviewed. No pertinent past surgical history.  History reviewed. No pertinent family history. Social History:  reports that she has never smoked. She has never used smokeless tobacco. She reports that she does not drink alcohol or use illicit drugs.  Allergies: No Known Allergies  Medications Prior to Admission  Medication Sig Dispense Refill  . methocarbamol (ROBAXIN) 500 MG tablet Take 500 mg by mouth 4 (four) times daily.      Marland Kitchen NIFEdipine (PROCARDIA-XL/ADALAT-CC/NIFEDICAL-XL) 30 MG 24 hr tablet Take 30 mg by mouth daily.        Results for orders placed during the hospital encounter of 02/13/13 (from the past 48 hour(s))  PREGNANCY, URINE     Status: None   Collection Time    02/13/13 12:41 AM      Result Value Range   Preg Test, Ur NEGATIVE  NEGATIVE   Comment:            THE SENSITIVITY OF THIS     METHODOLOGY IS >20 mIU/mL.  URINALYSIS, ROUTINE W REFLEX MICROSCOPIC     Status: None   Collection Time    02/13/13 12:41 AM      Result Value Range   Color, Urine YELLOW  YELLOW   APPearance CLEAR  CLEAR   Specific Gravity, Urine 1.014  1.005 - 1.030   pH 6.0  5.0 - 8.0   Glucose, UA NEGATIVE  NEGATIVE mg/dL   Hgb urine dipstick NEGATIVE  NEGATIVE   Bilirubin Urine NEGATIVE  NEGATIVE   Ketones, ur NEGATIVE  NEGATIVE mg/dL   Protein, ur NEGATIVE  NEGATIVE mg/dL   Urobilinogen, UA 0.2  0.0 - 1.0 mg/dL   Nitrite  NEGATIVE  NEGATIVE   Leukocytes, UA NEGATIVE  NEGATIVE   Comment: MICROSCOPIC NOT DONE ON URINES WITH NEGATIVE PROTEIN, BLOOD, LEUKOCYTES, NITRITE, OR GLUCOSE <1000 mg/dL.  COMPREHENSIVE METABOLIC PANEL     Status: Abnormal   Collection Time    02/13/13  3:47 AM      Result Value Range   Sodium 138  135 - 145 mEq/L   Potassium 4.0  3.5 - 5.1 mEq/L   Chloride 103  96 - 112 mEq/L   CO2 24  19 - 32 mEq/L   Glucose, Bld 115 (*) 70 - 99 mg/dL   BUN 11  6 - 23 mg/dL   Creatinine, Ser 6.21  0.50 - 1.10 mg/dL   Calcium 9.2  8.4 - 30.8 mg/dL   Total Protein 7.6  6.0 - 8.3 g/dL   Albumin 3.7  3.5 - 5.2 g/dL   AST 657 (*) 0 - 37 U/L   ALT 338 (*) 0 - 35 U/L   Alkaline Phosphatase 86  39 - 117 U/L   Total Bilirubin 0.6  0.3 - 1.2 mg/dL   GFR calc non Af Amer >90  >90 mL/min  GFR calc Af Amer >90  >90 mL/min   Comment:            The eGFR has been calculated     using the CKD EPI equation.     This calculation has not been     validated in all clinical     situations.     eGFR's persistently     <90 mL/min signify     possible Chronic Kidney Disease.  LIPASE, BLOOD     Status: None   Collection Time    02/13/13  3:47 AM      Result Value Range   Lipase 40  11 - 59 U/L  CBC WITH DIFFERENTIAL     Status: Abnormal   Collection Time    02/13/13  3:47 AM      Result Value Range   WBC 6.3  4.0 - 10.5 K/uL   RBC 5.59 (*) 3.87 - 5.11 MIL/uL   Hemoglobin 12.9  12.0 - 15.0 g/dL   HCT 78.2  95.6 - 21.3 %   MCV 69.4 (*) 78.0 - 100.0 fL   MCH 23.1 (*) 26.0 - 34.0 pg   MCHC 33.2  30.0 - 36.0 g/dL   RDW 08.6  57.8 - 46.9 %   Platelets 277  150 - 400 K/uL   Neutrophils Relative 75  43 - 77 %   Lymphocytes Relative 16  12 - 46 %   Monocytes Relative 9  3 - 12 %   Eosinophils Relative 0  0 - 5 %   Basophils Relative 0  0 - 1 %   Neutro Abs 4.7  1.7 - 7.7 K/uL   Lymphs Abs 1.0  0.7 - 4.0 K/uL   Monocytes Absolute 0.6  0.1 - 1.0 K/uL   Eosinophils Absolute 0.0  0.0 - 0.7 K/uL   Basophils  Absolute 0.0  0.0 - 0.1 K/uL   RBC Morphology ELLIPTOCYTES     Comment: STOMATOCYTES   WBC Morphology Karlei Waldo COUNT CONFIRMED ON SMEAR     Smear Review PLATELET COUNT CONFIRMED BY SMEAR     Dg Chest 2 View  02/13/2013  *RADIOLOGY REPORT*  Clinical Data: Mid chest pain and epigastric pain; shortness of breath.  CHEST - 2 VIEW  Comparison: None.  Findings: The lungs are well-aerated and clear.  There is no evidence of focal opacification, pleural effusion or pneumothorax.  The heart is borderline normal in size; the mediastinal contour is within normal limits.  No acute osseous abnormalities are seen.  IMPRESSION: No acute cardiopulmonary process seen.   Original Report Authenticated By: Tonia Ghent, M.D.    US Abdomen Complete  02/13/2013  *RADIOLOGY REPORT*  Clinical Data:  Chest pain and abnormal gallbladder on recent abdominal CT.  COMPLETE ABDOMINAL ULTRASOUND  Comparison:  Abdominal CT 02/13/2013  Findings:  Gallbladder:  There are multiple echogenic stones in the gallbladder.  The gallbladder wall is thickened, measuring up to 9 mm and there may be edema within the gallbladder wall.  The patient does have a sonographic Murphy's sign.  Many of the echogenic gallstones demonstrate posterior acoustic shadowing.  Common bile duct:  Common bile duct measures 0.3 cm.  Liver:  No focal lesion identified.  Within normal limits in parenchymal echogenicity.  IVC:  Appears normal.  Pancreas:  Pancreatic tail is not visualized due to bowel gas.  Spleen:  Spleen length is 6.2 cm.  No gross abnormality to the spleen.  Right Kidney:  Right kidney is difficult  to evaluate due to shadowing.  No evidence for hydronephrosis.  Left Kidney:  Left kidney measures 10.1 cm in length without hydronephrosis.  Abdominal aorta:  The distal and mid abdominal aorta are difficult to evaluate due to bowel gas.  IMPRESSION: Cholelithiasis with gallbladder wall thickening and a sonographic Murphy's sign.  Findings are suggestive for  acute cholecystitis.  No evidence for biliary dilatation.  Many of the intra-abdominal structures are poorly visualized due to bowel gas.   Original Report Authenticated By: Richarda Overlie, M.D.    Ct Abdomen Pelvis W Contrast  02/13/2013  *RADIOLOGY REPORT*  Clinical Data: Epigastric abdominal pain.  CT ABDOMEN AND PELVIS WITH CONTRAST  Technique:  Multidetector CT imaging of the abdomen and pelvis was performed following the standard protocol during bolus administration of intravenous contrast.  Contrast: 100 mL of Omnipaque 300 IV contrast  Comparison: None.  Findings: The visualized lung bases are clear.  There is mild soft tissue inflammation about the gallbladder, with mild apparent gallbladder wall thickening.  Mildly increased attenuation layering within the gallbladder may reflect sludge.  The liver is unremarkable in appearance.  A small amount of high- attenuation fluid is noted surrounding the spleen, which could reflect trace blood.  Would correlate for evidence of recent traumatic injury.  The pancreas and adrenal glands are unremarkable in appearance.  The kidneys are unremarkable in appearance.  There is no evidence of hydronephrosis.  No renal or ureteral stones are seen.  No perinephric stranding is appreciated.  No free fluid is identified.  The small bowel is unremarkable in appearance.  The stomach is within normal limits.  No acute vascular abnormalities are seen.  The appendix is normal in caliber and contains air, without evidence for appendicitis.  The colon is unremarkable in appearance.  The bladder is moderately distended and grossly unremarkable in appearance.  The uterus is grossly unremarkable, though difficult to fully assess.  The ovaries are relatively symmetric; no suspicious adnexal masses are seen.  No inguinal lymphadenopathy is seen.  No acute osseous abnormalities are identified.  IMPRESSION:  1.  Mild soft tissue inflammation about the gallbladder, with mild apparent  gallbladder wall thickening.  Likely sludge noted within the gallbladder.  Would correlate for evidence of cholecystitis, and consider further imaging as deemed clinically appropriate. 2.  Small amount of high-attenuation fluid noted surrounding the spleen; this is suspicious for trace blood.  Would correlate for evidence of recent traumatic injury.  This could be relatively chronic in nature.  These results were called by telephone on 02/13/2013 at 07:07 a.m. to Dr. Brock Bad, who verbally acknowledged these results.   Original Report Authenticated By: Tonia Ghent, M.D.     Review of Systems  Gastrointestinal: Positive for abdominal pain. Negative for nausea, vomiting, diarrhea and constipation.  All other systems reviewed and are negative.    Blood pressure 145/91, pulse 75, temperature 97.2 F (36.2 C), temperature source Oral, resp. rate 15, height 5\' 6"  (1.676 m), last menstrual period 01/18/2013, SpO2 100.00%. Physical Exam  Constitutional: She is oriented to person, place, and time. She appears well-developed and well-nourished. No distress.  HENT:  Head: Normocephalic and atraumatic.  Eyes: Conjunctivae are normal. Pupils are equal, round, and reactive to light.  Neck: Normal range of motion. Neck supple.  Cardiovascular: Normal rate and regular rhythm.   Respiratory: Effort normal and breath sounds normal.  GI: Bowel sounds are normal. She exhibits no distension. There is tenderness (under ribs and around xypoid). There is no  rebound and no guarding.  Genitourinary:  deferred  Musculoskeletal: Normal range of motion.  Neurological: She is alert and oriented to person, place, and time.  Skin: Skin is warm and dry.  Psychiatric: She has a normal mood and affect. Her behavior is normal. Thought content normal.     Assessment/Plan 1. Acute cholecystitis: the patient will be admitted, placed on IVFs, pain meds, Unasyn, and antiemetics.  She will be consented for laparoscopic  cholecystectomy.  I have explained the procedure, risks, and aftercare of cholecystectomy.  Risks include but are not limited to bleeding, infection, wound problems, anesthesia, diarrhea, bile leak, injury to common bile duct/liver/intestine.  She seems to understand and agrees to proceed  Chevon Laufer 02/13/2013, 12:43 PM

## 2013-02-14 ENCOUNTER — Encounter (HOSPITAL_COMMUNITY): Payer: Self-pay | Admitting: General Surgery

## 2013-02-14 DIAGNOSIS — K810 Acute cholecystitis: Secondary | ICD-10-CM

## 2013-02-14 LAB — CBC
HCT: 37.5 % (ref 36.0–46.0)
MCH: 22.4 pg — ABNORMAL LOW (ref 26.0–34.0)
MCHC: 31.7 g/dL (ref 30.0–36.0)
Platelets: 200 10*3/uL (ref 150–400)
RDW: 14.3 % (ref 11.5–15.5)
WBC: 6.6 10*3/uL (ref 4.0–10.5)

## 2013-02-14 LAB — COMPREHENSIVE METABOLIC PANEL
Alkaline Phosphatase: 141 U/L — ABNORMAL HIGH (ref 39–117)
BUN: 7 mg/dL (ref 6–23)
GFR calc Af Amer: >90 mL/min (ref >90)
Glucose, Bld: 135 mg/dL — ABNORMAL HIGH (ref 70–99)
Potassium: 4.1 mEq/L (ref 3.5–5.1)
Total Protein: 6.8 g/dL (ref 6.0–8.3)

## 2013-02-14 LAB — BILIRUBIN, DIRECT: Bilirubin, Direct: 0.5 mg/dL — ABNORMAL HIGH (ref 0.0–0.3)

## 2013-02-14 MED FILL — Fentanyl Citrate Inj 0.05 MG/ML: INTRAMUSCULAR | Qty: 2 | Status: AC

## 2013-02-14 NOTE — Care Management Note (Signed)
    Page 1 of 1   02/14/2013     10:39:17 AM   CARE MANAGEMENT NOTE 02/14/2013  Patient:  Stacy Harrell, Stacy Harrell   Account Number:  [id number]  Date Initiated:  02/14/2013  Documentation initiated by:  Lorenda Ishihara  Subjective/Objective Assessment:   37 yo female admitted s/p lap chole with liver bx. PTA lived at home with spouse.     Action/Plan:   Home when stable   Anticipated DC Date:  02/16/2013   Anticipated DC Plan:  HOME/SELF CARE      DC Planning Services  CM consult      Choice offered to / List presented to:             Status of service:  Completed, signed off Medicare Important Message given?   (If response is "NO", the following Medicare IM given date fields will be blank) Date Medicare IM given:   Date Additional Medicare IM given:    Discharge Disposition:  HOME/SELF CARE  Per UR Regulation:  Reviewed for med. necessity/level of care/duration of stay  If discussed at Long Length of Stay Meetings, dates discussed:    Comments:

## 2013-02-14 NOTE — Progress Notes (Signed)
Patient ID: Stacy Harrell, female   DOB: Jan 28, 1976, 37 y.o.   MRN: [id number] 1 Day Post-Op  Subjective: Pt states feeling "so-so".  Denies nausea or vomiting, feels weak, pain overall controlled  Objective: Vital signs in last 24 hours: Temp:  [97.2 F (36.2 C)-99.1 F (37.3 C)] 97.9 F (36.6 C) (04/11 0451) Pulse Rate:  [67-106] 92 (04/11 0451) Resp:  [12-20] 18 (04/11 0451) BP: (124-163)/(80-97) 136/89 mmHg (04/11 0451) SpO2:  [100 %] 100 % (04/11 0451) Weight:  [182 lb 1.6 oz (82.6 kg)] 182 lb 1.6 oz (82.6 kg) (04/10 1322)    Intake/Output from previous day: 04/10 0701 - 04/11 0700 In: 913.8 [P.O.:120; I.V.:693.8; IV Piggyback:100] Out: 750 [Urine:750] Intake/Output this shift:    PE: Abd: soft, expected tenderness, +BS, dressings dry General: appears weak, NAD  Lab Results:   Recent Labs  02/13/13 0347 02/14/13 0405  WBC 6.3 6.6  HGB 12.9 11.9*  HCT 38.8 37.5  PLT 277 200   BMET  Recent Labs  02/13/13 0347 02/14/13 0405  NA 138 135  K 4.0 4.1  CL 103 101  CO2 24 24  GLUCOSE 115* 135*  BUN 11 7  CREATININE 0.60 0.49*  CALCIUM 9.2 8.7   PT/INR No results found for this basename: LABPROT, INR,  in the last 72 hours CMP     Component Value Date/Time   NA 135 02/14/2013 0405   K 4.1 02/14/2013 0405   CL 101 02/14/2013 0405   CO2 24 02/14/2013 0405   GLUCOSE 135* 02/14/2013 0405   BUN 7 02/14/2013 0405   CREATININE 0.49* 02/14/2013 0405   CALCIUM 8.7 02/14/2013 0405   PROT 6.8 02/14/2013 0405   ALBUMIN 3.1* 02/14/2013 0405   AST 895* 02/14/2013 0405   ALT 1270* 02/14/2013 0405   ALKPHOS 141* 02/14/2013 0405   BILITOT 1.1 02/14/2013 0405   GFRNONAA >90 02/14/2013 0405   GFRAA >90 02/14/2013 0405   Lipase     Component Value Date/Time   LIPASE 40 02/13/2013 0347       Studies/Results: Dg Chest 2 View  02/13/2013  *RADIOLOGY REPORT*  Clinical Data: Mid chest pain and epigastric pain; shortness of breath.  CHEST - 2 VIEW  Comparison: None.  Findings: The  lungs are well-aerated and clear.  There is no evidence of focal opacification, pleural effusion or pneumothorax.  The heart is borderline normal in size; the mediastinal contour is within normal limits.  No acute osseous abnormalities are seen.  IMPRESSION: No acute cardiopulmonary process seen.   Original Report Authenticated By: Tonia Ghent, M.D.    Dg Cholangiogram Operative  02/13/2013  *RADIOLOGY REPORT*  Clinical Data:   Cholecystectomy, gallstones  INTRAOPERATIVE CHOLANGIOGRAM  Technique:  Cholangiographic images from the C-arm fluoroscopic device were submitted for interpretation post-operatively.  Please see the procedural report for the amount of contrast and the fluoroscopy time utilized.  Comparison:  Preoperative ultrasound and CT 02/13/2013  Findings:  Artifact from support apparatus overlies the right upper quadrant.  There is good opacification of the normal caliber common duct and proximal intrahepatic ducts without filling defect.  There is prompt excretion into nondilated duodenum.  IMPRESSION: No filling defect or ductal dilatation at intraoperative cholangiogram.   Original Report Authenticated By: Christiana Pellant, M.D.    US Abdomen Complete  02/13/2013  *RADIOLOGY REPORT*  Clinical Data:  Chest pain and abnormal gallbladder on recent abdominal CT.  COMPLETE ABDOMINAL ULTRASOUND  Comparison:  Abdominal CT 02/13/2013  Findings:  Gallbladder:  There  are multiple echogenic stones in the gallbladder.  The gallbladder wall is thickened, measuring up to 9 mm and there may be edema within the gallbladder wall.  The patient does have a sonographic Murphy's sign.  Many of the echogenic gallstones demonstrate posterior acoustic shadowing.  Common bile duct:  Common bile duct measures 0.3 cm.  Liver:  No focal lesion identified.  Within normal limits in parenchymal echogenicity.  IVC:  Appears normal.  Pancreas:  Pancreatic tail is not visualized due to bowel gas.  Spleen:  Spleen length is 6.2  cm.  No gross abnormality to the spleen.  Right Kidney:  Right kidney is difficult to evaluate due to shadowing.  No evidence for hydronephrosis.  Left Kidney:  Left kidney measures 10.1 cm in length without hydronephrosis.  Abdominal aorta:  The distal and mid abdominal aorta are difficult to evaluate due to bowel gas.  IMPRESSION: Cholelithiasis with gallbladder wall thickening and a sonographic Murphy's sign.  Findings are suggestive for acute cholecystitis.  No evidence for biliary dilatation.  Many of the intra-abdominal structures are poorly visualized due to bowel gas.   Original Report Authenticated By: Richarda Overlie, M.D.    Ct Abdomen Pelvis W Contrast  02/13/2013  *RADIOLOGY REPORT*  Clinical Data: Epigastric abdominal pain.  CT ABDOMEN AND PELVIS WITH CONTRAST  Technique:  Multidetector CT imaging of the abdomen and pelvis was performed following the standard protocol during bolus administration of intravenous contrast.  Contrast: 100 mL of Omnipaque 300 IV contrast  Comparison: None.  Findings: The visualized lung bases are clear.  There is mild soft tissue inflammation about the gallbladder, with mild apparent gallbladder wall thickening.  Mildly increased attenuation layering within the gallbladder may reflect sludge.  The liver is unremarkable in appearance.  A small amount of high- attenuation fluid is noted surrounding the spleen, which could reflect trace blood.  Would correlate for evidence of recent traumatic injury.  The pancreas and adrenal glands are unremarkable in appearance.  The kidneys are unremarkable in appearance.  There is no evidence of hydronephrosis.  No renal or ureteral stones are seen.  No perinephric stranding is appreciated.  No free fluid is identified.  The small bowel is unremarkable in appearance.  The stomach is within normal limits.  No acute vascular abnormalities are seen.  The appendix is normal in caliber and contains air, without evidence for appendicitis.  The  colon is unremarkable in appearance.  The bladder is moderately distended and grossly unremarkable in appearance.  The uterus is grossly unremarkable, though difficult to fully assess.  The ovaries are relatively symmetric; no suspicious adnexal masses are seen.  No inguinal lymphadenopathy is seen.  No acute osseous abnormalities are identified.  IMPRESSION:  1.  Mild soft tissue inflammation about the gallbladder, with mild apparent gallbladder wall thickening.  Likely sludge noted within the gallbladder.  Would correlate for evidence of cholecystitis, and consider further imaging as deemed clinically appropriate. 2.  Small amount of high-attenuation fluid noted surrounding the spleen; this is suspicious for trace blood.  Would correlate for evidence of recent traumatic injury.  This could be relatively chronic in nature.  These results were called by telephone on 02/13/2013 at 07:07 a.m. to Dr. Brock Bad, who verbally acknowledged these results.   Original Report Authenticated By: Tonia Ghent, M.D.     Anti-infectives: Anti-infectives   Start     Dose/Rate Route Frequency Ordered Stop   02/13/13 1845  Ampicillin-Sulbactam (UNASYN) 3 g in sodium chloride 0.9 %  100 mL IVPB     3 g 100 mL/hr over 60 Minutes Intravenous Every 6 hours 02/13/13 1833 02/14/13 0644   02/13/13 1400  piperacillin-tazobactam (ZOSYN) IVPB 3.375 g  Status:  Discontinued     3.375 g 12.5 mL/hr over 240 Minutes Intravenous 3 times per day 02/13/13 1239 02/13/13 1241   02/13/13 0915  Ampicillin-Sulbactam (UNASYN) 3 g in sodium chloride 0.9 % 100 mL IVPB     3 g 100 mL/hr over 60 Minutes Intravenous  Once 02/13/13 0908 02/13/13 1042       Assessment/Plan 1. POD#1-Lap Chole: GB was edematous but unsure that this was the main process, a liver biopsy was performed during surgery, her CBD was clear on cholangiogram however today her LFTs are almost double.  Given this we have ordered a hepatitis panel and will consult GI for a  possible primary liver process.  Will decrease fluids slightly today.    LOS: 1 day    Aksel Bencomo 02/14/2013

## 2013-02-14 NOTE — Consult Note (Signed)
Referring Provider: No ref. provider found Primary Care Physician:  No primary provider on file. Primary Gastroenterologist:  Gentry Fitz  Reason for Consultation:  Elevated LFT's.  HPI: Stacy Harrell is a 37 y.o. female who developed subxiphoid pain at about 11 PM on 4/9 as she was preparing for bed and presented to the ED on 4/10.  She described the pain as a tightness as if a band or rope was being tight around her. It radiated to the left into the right around to the back.  She denied nausea, vomiting, diarrhea or a history of abdominal surgery.  Says that the pain was sudden in onset and she had not experienced any similar pain in the past.  CT scan showed the following:  "1. Mild soft tissue inflammation about the gallbladder, with mild apparent gallbladder wall thickening. Likely sludge noted within the gallbladder. Would correlate for evidence of cholecystitis, and consider further imaging as deemed clinically appropriate.  2. Small amount of high-attenuation fluid noted surrounding the spleen; this is suspicious for trace blood. Would correlate for evidence of recent traumatic injury. This could be relatively chronic in nature."  Ultrasound was then performed and showed the following:  "Cholelithiasis with gallbladder wall thickening and a sonographic Murphy's sign. Findings are suggestive for acute cholecystitis. No evidence for biliary dilatation.  Many of the intra-abdominal structures are poorly visualized due to bowel gas."    CBC, BMP, and lipase were normal.  LFT's were elevated with normal ALP and total bili, but AST was 526 and ALT was 338.  She underwent a cholecystectomy yesterday and gallbladder was edematous, but Dr. Abbey Chatters was not convinced that it was diseased enough to have caused that significant increase in her LFT's, therefore, he performed a liver biopsy at the time of surgery as well.  IOC was performed and was clear.  Today LFT's have increased further to AST 895 and  ALT 1270.  ALP up slightly as well to 141, but total bili remains normal.  She is on Unasyn.  There are no previous lab studies for comparison to see if her LFT's were elevated in the past, but she says that she had labs performed by her PCP not long ago and was told that everything was normal at that time.  She denies ETOH and drug use.  Currently has some post-surgical abdominal pain and not passing flatus yet, but no nausea or vomiting.  Tolerated small amounts of liquids.  No family history of liver or gallbladder issues to her knowledge.  Patient's husband and cousin (who is a Psychologist, sport and exercise here) were present for the interview as well.   Past Medical History  Diagnosis Date  . Back pain   . Hypertension     History reviewed. No pertinent past surgical history.  Prior to Admission medications   Medication Sig Start Date End Date Taking? Authorizing Provider  Multiple Vitamin (MULTIVITAMIN WITH MINERALS) TABS Take 1 tablet by mouth daily.   Yes Historical Provider, MD  naproxen sodium (ANAPROX) 220 MG tablet Take 220 mg by mouth 2 (two) times daily as needed (for pain.).   Yes Historical Provider, MD    Current Facility-Administered Medications  Medication Dose Route Frequency Provider Last Rate Last Dose  . 0.9 % NaCl with KCl 20 mEq/ L  infusion   Intravenous Continuous Doristine Mango, PA-C 125 mL/hr at 02/14/13 0230    . diphenhydrAMINE (BENADRYL) injection 12.5-25 mg  12.5-25 mg Intravenous Q6H PRN Doristine Mango, PA-C  Or  . diphenhydrAMINE (BENADRYL) 12.5 MG/5ML elixir 12.5-25 mg  12.5-25 mg Oral Q6H PRN Doristine Mango, PA-C      . HYDROmorphone (DILAUDID) injection 0.5-1 mg  0.5-1 mg Intravenous Q2H PRN Doristine Mango, PA-C   1 mg at 02/13/13 1335  . ondansetron (ZOFRAN) injection 4 mg  4 mg Intravenous Q6H PRN Doristine Mango, PA-C      . oxyCODONE (Oxy IR/ROXICODONE) immediate release tablet 5-10 mg  5-10 mg Oral Q4H PRN Adolph Pollack, MD   10 mg at 02/14/13 0528     Allergies as of 02/13/2013  . (No Known Allergies)    History reviewed. No pertinent family history.  History   Social History  . Marital Status: Married    Spouse Name: N/A    Number of Children: N/A  . Years of Education: N/A   Occupational History  . Not on file.   Social History Main Topics  . Smoking status: Never Smoker   . Smokeless tobacco: Never Used  . Alcohol Use: No  . Drug Use: No  . Sexually Active: No   Other Topics Concern  . Not on file   Social History Narrative  . No narrative on file    Review of Systems: Ten point ROS is O/W negative except as mentioned in HPI.  Physical Exam: Vital signs in last 24 hours: Temp:  [97.2 F (36.2 C)-99.1 F (37.3 C)] 97.9 F (36.6 C) (04/11 0451) Pulse Rate:  [67-106] 92 (04/11 0451) Resp:  [12-20] 18 (04/11 0451) BP: (124-163)/(80-97) 136/89 mmHg (04/11 0451) SpO2:  [100 %] 100 % (04/11 0451) Weight:  [182 lb 1.6 oz (82.6 kg)] 182 lb 1.6 oz (82.6 kg) (04/10 1322)   General:   Alert, Well-developed, well-nourished, pleasant and cooperative in NAD; uncomfortable because of pain. Head:  Normocephalic and atraumatic. Eyes:  Sclera clear, no icterus.  Conjunctiva pink. Ears:  Normal auditory acuity. Mouth:  No deformity or lesions.   Lungs:  Clear throughout to auscultation.  No wheezes, crackles, or rhonchi.  Heart:  Regular rate and rhythm; no murmurs, clicks, rubs,  or gallops. Abdomen:  Soft, slightly distended, BS present.  Diffuse TTP without R/R/G. Rectal:  Deferred  Msk:  Symmetrical without gross deformities. Pulses:  Normal pulses noted. Extremities:  Without clubbing or edema. Neurologic:  Alert and  oriented x4;  grossly normal neurologically. Skin:  Intact without significant lesions or rashes. Psych:  Alert and cooperative. Normal mood and affect.  Intake/Output from previous day: 04/10 0701 - 04/11 0700 In: 913.8 [P.O.:120; I.V.:693.8; IV Piggyback:100] Out: 750  [Urine:750] Intake/Output this shift: Total I/O In: 120 [P.O.:120] Out: -   Lab Results:  Recent Labs  02/13/13 0347 02/14/13 0405  WBC 6.3 6.6  HGB 12.9 11.9*  HCT 38.8 37.5  PLT 277 200   BMET  Recent Labs  02/13/13 0347 02/14/13 0405  NA 138 135  K 4.0 4.1  CL 103 101  CO2 24 24  GLUCOSE 115* 135*  BUN 11 7  CREATININE 0.60 0.49*  CALCIUM 9.2 8.7   LFT  Recent Labs  02/14/13 0405  PROT 6.8  ALBUMIN 3.1*  AST 895*  ALT 1270*  ALKPHOS 141*  BILITOT 1.1  BILIDIR 0.5*    Studies/Results: Dg Chest 2 View  02/13/2013  *RADIOLOGY REPORT*  Clinical Data: Mid chest pain and epigastric pain; shortness of breath.  CHEST - 2 VIEW  Comparison: None.  Findings: The lungs are well-aerated and clear.  There is no  evidence of focal opacification, pleural effusion or pneumothorax.  The heart is borderline normal in size; the mediastinal contour is within normal limits.  No acute osseous abnormalities are seen.  IMPRESSION: No acute cardiopulmonary process seen.   Original Report Authenticated By: Tonia Ghent, M.D.    Dg Cholangiogram Operative  02/13/2013  *RADIOLOGY REPORT*  Clinical Data:   Cholecystectomy, gallstones  INTRAOPERATIVE CHOLANGIOGRAM  Technique:  Cholangiographic images from the C-arm fluoroscopic device were submitted for interpretation post-operatively.  Please see the procedural report for the amount of contrast and the fluoroscopy time utilized.  Comparison:  Preoperative ultrasound and CT 02/13/2013  Findings:  Artifact from support apparatus overlies the right upper quadrant.  There is good opacification of the normal caliber common duct and proximal intrahepatic ducts without filling defect.  There is prompt excretion into nondilated duodenum.  IMPRESSION: No filling defect or ductal dilatation at intraoperative cholangiogram.   Original Report Authenticated By: Christiana Pellant, M.D.    US Abdomen Complete  02/13/2013  *RADIOLOGY REPORT*  Clinical Data:   Chest pain and abnormal gallbladder on recent abdominal CT.  COMPLETE ABDOMINAL ULTRASOUND  Comparison:  Abdominal CT 02/13/2013  Findings:  Gallbladder:  There are multiple echogenic stones in the gallbladder.  The gallbladder wall is thickened, measuring up to 9 mm and there may be edema within the gallbladder wall.  The patient does have a sonographic Murphy's sign.  Many of the echogenic gallstones demonstrate posterior acoustic shadowing.  Common bile duct:  Common bile duct measures 0.3 cm.  Liver:  No focal lesion identified.  Within normal limits in parenchymal echogenicity.  IVC:  Appears normal.  Pancreas:  Pancreatic tail is not visualized due to bowel gas.  Spleen:  Spleen length is 6.2 cm.  No gross abnormality to the spleen.  Right Kidney:  Right kidney is difficult to evaluate due to shadowing.  No evidence for hydronephrosis.  Left Kidney:  Left kidney measures 10.1 cm in length without hydronephrosis.  Abdominal aorta:  The distal and mid abdominal aorta are difficult to evaluate due to bowel gas.  IMPRESSION: Cholelithiasis with gallbladder wall thickening and a sonographic Murphy's sign.  Findings are suggestive for acute cholecystitis.  No evidence for biliary dilatation.  Many of the intra-abdominal structures are poorly visualized due to bowel gas.   Original Report Authenticated By: Richarda Overlie, M.D.    Ct Abdomen Pelvis W Contrast  02/13/2013  *RADIOLOGY REPORT*  Clinical Data: Epigastric abdominal pain.  CT ABDOMEN AND PELVIS WITH CONTRAST  Technique:  Multidetector CT imaging of the abdomen and pelvis was performed following the standard protocol during bolus administration of intravenous contrast.  Contrast: 100 mL of Omnipaque 300 IV contrast  Comparison: None.  Findings: The visualized lung bases are clear.  There is mild soft tissue inflammation about the gallbladder, with mild apparent gallbladder wall thickening.  Mildly increased attenuation layering within the gallbladder may  reflect sludge.  The liver is unremarkable in appearance.  A small amount of high- attenuation fluid is noted surrounding the spleen, which could reflect trace blood.  Would correlate for evidence of recent traumatic injury.  The pancreas and adrenal glands are unremarkable in appearance.  The kidneys are unremarkable in appearance.  There is no evidence of hydronephrosis.  No renal or ureteral stones are seen.  No perinephric stranding is appreciated.  No free fluid is identified.  The small bowel is unremarkable in appearance.  The stomach is within normal limits.  No acute vascular abnormalities are  seen.  The appendix is normal in caliber and contains air, without evidence for appendicitis.  The colon is unremarkable in appearance.  The bladder is moderately distended and grossly unremarkable in appearance.  The uterus is grossly unremarkable, though difficult to fully assess.  The ovaries are relatively symmetric; no suspicious adnexal masses are seen.  No inguinal lymphadenopathy is seen.  No acute osseous abnormalities are identified.  IMPRESSION:  1.  Mild soft tissue inflammation about the gallbladder, with mild apparent gallbladder wall thickening.  Likely sludge noted within the gallbladder.  Would correlate for evidence of cholecystitis, and consider further imaging as deemed clinically appropriate. 2.  Small amount of high-attenuation fluid noted surrounding the spleen; this is suspicious for trace blood.  Would correlate for evidence of recent traumatic injury.  This could be relatively chronic in nature.  These results were called by telephone on 02/13/2013 at 07:07 a.m. to Dr. Brock Bad, who verbally acknowledged these results.   Original Report Authenticated By: Tonia Ghent, M.D.     IMPRESSION: -Elevated LFT's:  Elevated on admission when she presented with acute abdominal pain and now further increase post cholecystectomy.  IOC negative.  No previous labs for comparison, but patient and  family report that recent labs by PCP were normal.  Liver normal on CT and ultrasound, but liver biopsy obtained during surgery.   PLAN: -Continue to trend LFT's; may take weeks for them to return to normal. -Await results of liver biopsy. -awaiting acute hepatitis panel. -Will likely hold off with any further imaging or serology evaluation, but will discuss with Dr. Christella Hartigan.   ZEHR, JESSICA D.  02/14/2013, 9:41 AM  Pager number [phone]   ________________________________________________________________________  [facility] MD note:  I personally examined the patient, reviewed the data and agree with the assessment and plan described above.    The LFTs seem out of proportion to the GB inflammation and my indicate primary liver pathology (viral, autoimmune?).  Acute hepatitis panel was drawn already,  I will add ANA, AMA, ASMA, HIV, other testing.  Her husband was in room when I spoke with her, she denied any clear risk factors for viral hepatitis.  Dr. Jeani Hawking is covering [facility] this weekend and he will follow along.   Rob Bunting, MD [facility] Pager [phone]

## 2013-02-14 NOTE — Progress Notes (Signed)
The condition of her gallbladder, although it was mildly inflamed and edematous, did not explain the significant elevation of her LFTs.  She is still having the same pain.  Will check a hepatitis panel and ask GI to see.

## 2013-02-15 ENCOUNTER — Encounter (HOSPITAL_COMMUNITY): Payer: Self-pay | Admitting: Surgery

## 2013-02-15 DIAGNOSIS — I10 Essential (primary) hypertension: Secondary | ICD-10-CM

## 2013-02-15 DIAGNOSIS — R7401 Elevation of levels of liver transaminase levels: Secondary | ICD-10-CM

## 2013-02-15 DIAGNOSIS — K810 Acute cholecystitis: Secondary | ICD-10-CM

## 2013-02-15 HISTORY — DX: Elevation of levels of liver transaminase levels: R74.01

## 2013-02-15 HISTORY — DX: Acute cholecystitis: K81.0

## 2013-02-15 LAB — CBC
HCT: 35.8 % — ABNORMAL LOW (ref 36.0–46.0)
Hemoglobin: 11.2 g/dL — ABNORMAL LOW (ref 12.0–15.0)
MCH: 22.3 pg — ABNORMAL LOW (ref 26.0–34.0)
MCHC: 31.3 g/dL (ref 30.0–36.0)
MCV: 71.2 fL — ABNORMAL LOW (ref 78.0–100.0)
Platelets: 183 10*3/uL (ref 150–400)
RBC: 5.03 MIL/uL (ref 3.87–5.11)
RDW: 14.4 % (ref 11.5–15.5)
WBC: 6.1 10*3/uL (ref 4.0–10.5)

## 2013-02-15 LAB — COMPREHENSIVE METABOLIC PANEL
ALT: 761 U/L — ABNORMAL HIGH (ref 0–35)
AST: 306 U/L — ABNORMAL HIGH (ref 0–37)
Albumin: 2.8 g/dL — ABNORMAL LOW (ref 3.5–5.2)
Alkaline Phosphatase: 124 U/L — ABNORMAL HIGH (ref 39–117)
BUN: 7 mg/dL (ref 6–23)
CO2: 27 mEq/L (ref 19–32)
Calcium: 8.6 mg/dL (ref 8.4–10.5)
Chloride: 105 mEq/L (ref 96–112)
Creatinine, Ser: 0.62 mg/dL (ref 0.50–1.10)
GFR calc Af Amer: >90 mL/min (ref >90)
GFR calc non Af Amer: >90 mL/min (ref >90)
Glucose, Bld: 94 mg/dL (ref 70–99)
Potassium: 4 mEq/L (ref 3.5–5.1)
Sodium: 139 mEq/L (ref 135–145)
Total Bilirubin: 0.5 mg/dL (ref 0.3–1.2)
Total Protein: 6.1 g/dL (ref 6.0–8.3)

## 2013-02-15 LAB — PROTIME-INR
INR: 1.08 (ref 0.00–1.49)
Prothrombin Time: 13.9 seconds (ref 11.6–15.2)

## 2013-02-15 MED ORDER — NAPROXEN 500 MG PO TABS: 500.0000 mg | ORAL_TABLET | Freq: Two times a day (BID) | ORAL | Status: DC

## 2013-02-15 MED ORDER — LACTATED RINGERS IV BOLUS (SEPSIS): 1000.0000 mL | Freq: Three times a day (TID) | INTRAVENOUS | Status: DC | PRN

## 2013-02-15 MED ORDER — SODIUM CHLORIDE 0.9 % IJ SOLN: 3.0000 mL | INTRAMUSCULAR | Status: DC | PRN

## 2013-02-15 MED ORDER — MAGNESIUM HYDROXIDE 400 MG/5ML PO SUSP: 30.0000 mL | Freq: Two times a day (BID) | ORAL | Status: DC | PRN

## 2013-02-15 MED ORDER — BISACODYL 10 MG RE SUPP: 10.0000 mg | Freq: Two times a day (BID) | RECTAL | Status: DC | PRN

## 2013-02-15 MED ORDER — NAPROXEN 500 MG PO TABS
500.0000 mg | ORAL_TABLET | Freq: Two times a day (BID) | ORAL | Status: DC
  Filled 2013-02-15 (×2): qty 1

## 2013-02-15 MED ORDER — MAGIC MOUTHWASH
15.0000 mL | Freq: Four times a day (QID) | ORAL | Status: DC | PRN
  Filled 2013-02-15: qty 15

## 2013-02-15 MED ORDER — METOPROLOL TARTRATE 12.5 MG HALF TABLET
12.5000 mg | ORAL_TABLET | Freq: Two times a day (BID) | ORAL | Status: DC
  Administered 2013-02-15: 12.5 mg via ORAL
  Filled 2013-02-15 (×2): qty 1

## 2013-02-15 MED ORDER — HYDROMORPHONE HCL PF 1 MG/ML IJ SOLN: 0.5000 mg | INTRAMUSCULAR | Status: DC | PRN

## 2013-02-15 MED ORDER — OXYCODONE HCL 5 MG PO TABS: 5.0000 mg | ORAL_TABLET | ORAL | Status: DC | PRN

## 2013-02-15 MED ORDER — ALUM & MAG HYDROXIDE-SIMETH 200-200-20 MG/5ML PO SUSP: 30.0000 mL | Freq: Four times a day (QID) | ORAL | Status: DC | PRN

## 2013-02-15 MED ORDER — LIP MEDEX EX OINT
1.0000 "application " | TOPICAL_OINTMENT | Freq: Two times a day (BID) | CUTANEOUS | Status: DC
  Administered 2013-02-15: 1 via TOPICAL
  Filled 2013-02-15: qty 7

## 2013-02-15 MED ORDER — SODIUM CHLORIDE 0.9 % IJ SOLN
3.0000 mL | Freq: Two times a day (BID) | INTRAMUSCULAR | Status: DC
  Administered 2013-02-15: 3 mL via INTRAVENOUS

## 2013-02-15 NOTE — Progress Notes (Signed)
Progress Note for [facility]  Subjective: No acute events.  Feeling better, but sore from the incision sites.  Objective: Vital signs in last 24 hours: Temp:  [97.9 F (36.6 C)-99.1 F (37.3 C)] 97.9 F (36.6 C) (04/12 0611) Pulse Rate:  [78-89] 81 (04/12 0611) Resp:  [16-22] 18 (04/12 0611) BP: (135-151)/(84-94) 137/84 mmHg (04/12 0611) SpO2:  [99 %-100 %] 99 % (04/12 0611)    Intake/Output from previous day: 04/11 0701 - 04/12 0700 In: 2480 [P.O.:480; I.V.:2000] Out: 2200 [Urine:2200] Intake/Output this shift:    General appearance: alert and no distress GI: tender at the incision sites  Lab Results:  Recent Labs  02/13/13 0347 02/14/13 0405 02/15/13 0500  WBC 6.3 6.6 6.1  HGB 12.9 11.9* 11.2*  HCT 38.8 37.5 35.8*  PLT 277 200 183   BMET  Recent Labs  02/13/13 0347 02/14/13 0405 02/15/13 0500  NA 138 135 139  K 4.0 4.1 4.0  CL 103 101 105  CO2 24 24 27   GLUCOSE 115* 135* 94  BUN 11 7 7   CREATININE 0.60 0.49* 0.62  CALCIUM 9.2 8.7 8.6   LFT  Recent Labs  02/14/13 0405 02/15/13 0500  PROT 6.8 6.1  ALBUMIN 3.1* 2.8*  AST 895* 306*  ALT 1270* 761*  ALKPHOS 141* 124*  BILITOT 1.1 0.5  BILIDIR 0.5*  --    PT/INR  Recent Labs  02/15/13 0500  LABPROT 13.9  INR 1.08   Hepatitis Panel No results found for this basename: HEPBSAG, HCVAB, HEPAIGM, HEPBIGM,  in the last 72 hours C-Diff No results found for this basename: CDIFFTOX,  in the last 72 hours Fecal Lactopherrin No results found for this basename: FECLLACTOFRN,  in the last 72 hours  Studies/Results: Dg Cholangiogram Operative  02/13/2013  *RADIOLOGY REPORT*  Clinical Data:   Cholecystectomy, gallstones  INTRAOPERATIVE CHOLANGIOGRAM  Technique:  Cholangiographic images from the C-arm fluoroscopic device were submitted for interpretation post-operatively.  Please see the procedural report for the amount of contrast and the fluoroscopy time utilized.  Comparison:  Preoperative  ultrasound and CT 02/13/2013  Findings:  Artifact from support apparatus overlies the right upper quadrant.  There is good opacification of the normal caliber common duct and proximal intrahepatic ducts without filling defect.  There is prompt excretion into nondilated duodenum.  IMPRESSION: No filling defect or ductal dilatation at intraoperative cholangiogram.   Original Report Authenticated By: Christiana Pellant, M.D.    US Abdomen Complete  02/13/2013  *RADIOLOGY REPORT*  Clinical Data:  Chest pain and abnormal gallbladder on recent abdominal CT.  COMPLETE ABDOMINAL ULTRASOUND  Comparison:  Abdominal CT 02/13/2013  Findings:  Gallbladder:  There are multiple echogenic stones in the gallbladder.  The gallbladder wall is thickened, measuring up to 9 mm and there may be edema within the gallbladder wall.  The patient does have a sonographic Murphy's sign.  Many of the echogenic gallstones demonstrate posterior acoustic shadowing.  Common bile duct:  Common bile duct measures 0.3 cm.  Liver:  No focal lesion identified.  Within normal limits in parenchymal echogenicity.  IVC:  Appears normal.  Pancreas:  Pancreatic tail is not visualized due to bowel gas.  Spleen:  Spleen length is 6.2 cm.  No gross abnormality to the spleen.  Right Kidney:  Right kidney is difficult to evaluate due to shadowing.  No evidence for hydronephrosis.  Left Kidney:  Left kidney measures 10.1 cm in length without hydronephrosis.  Abdominal aorta:  The distal and mid abdominal aorta  are difficult to evaluate due to bowel gas.  IMPRESSION: Cholelithiasis with gallbladder wall thickening and a sonographic Murphy's sign.  Findings are suggestive for acute cholecystitis.  No evidence for biliary dilatation.  Many of the intra-abdominal structures are poorly visualized due to bowel gas.   Original Report Authenticated By: Richarda Overlie, M.D.     Medications:  Scheduled:  Continuous: . 0.9 % NaCl with KCl 20 mEq / L 75 mL/hr at 02/15/13 0024     Assessment/Plan: 1) Hepatitis - ? Etiology.   Clinically the patient is improving.   It appears her transaminases have peaked and now are trending downwards.  I am suspicious for HBV as she is African.  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  I do agree with checking for autoimmune causes as well.  Plan: 1) Await serology. 2) Follow up liver biopsy report. 3) Continue with supportive care.  LOS: 2 days   Ren Grasse D 02/15/2013, 7:38 AM

## 2013-02-15 NOTE — Progress Notes (Signed)
Spoke with Dr. Elnoria Howard via phone regarding orders to dc pt home per Dr. Michaell Cowing. Surgery ready to dc if OK with GI. Dr. Elnoria Howard confirmed discharge appropriate.

## 2013-02-15 NOTE — Progress Notes (Signed)
Assessment unchanged. Pt and husband verbalized understanding of dc instructions. Script x 1 given as provided by MD. Pt and husband had expressed concern earlier they needed work releases for their employers. Both made aware that Dr. Daphine Deutscher recently contacted and aware of their request with instruction to call [employer] office Monday and their employers would be faxed any needed work releases. Both verbalized understanding. Introduced to My Chart via teach back. Pt discharged via wc to front entrance to meet awaiting vehicle to carry home. Accompanied by husband, family and NT.

## 2013-02-15 NOTE — Progress Notes (Addendum)
Stacy Harrell [id number] [birth year]  CARE TEAM:  PCP: No primary provider on file.  Outpatient Care Team: Patient has no care team.  Inpatient Treatment Team: Treatment Team: Attending Provider: Bishop Limbo, MD; Attending Physician: [facility] [place]; Registered Nurse: Dina Rich, RN; Consulting Physician: Rachael Fee, MD; Attending Physician: Theda Belfast, MD   Subjective:  Husband in room Pain better controlled Trying solid food    Objective:  Vital signs:  Filed Vitals:   02/14/13 1409 02/14/13 1836 02/14/13 2232 02/15/13 0611  BP: 135/88 151/94 149/94 137/84  Pulse: 88 79 78 81  Temp: 99.1 F (37.3 C) 98.9 F (37.2 C) 98.1 F (36.7 C) 97.9 F (36.6 C)  TempSrc: Oral Oral Oral Oral  Resp: 16 18 22 18   Height:      Weight:      SpO2: 99% 99% 99% 99%       Intake/Output   Yesterday:  04/11 0701 - 04/12 0700 In: 2480 [P.O.:480; I.V.:2000] Out: 2200 [Urine:2200] This shift:     Bowel function:  Flatus: y  BM: y  Drain: n/a  Physical Exam:  General: Pt awake/alert/oriented x4 in no acute distress Eyes: PERRL, normal EOM.  Sclera clear.  No icterus Neuro: CN II-XII intact w/o focal sensory/motor deficits. Lymph: No head/neck/groin lymphadenopathy Psych:  No delerium/psychosis/paranoia HENT: Normocephalic, Mucus membranes moist.  No thrush Neck: Supple, No tracheal deviation Chest: No chest wall pain w good excursion CV:  Pulses intact.  Regular rhythm MS: Normal AROM mjr joints.  No obvious deformity Abdomen: Soft.  Nondistended.  Mildly tender at incisions only.  No evidence of peritonitis.  No incarcerated hernias. Ext:  SCDs BLE.  No mjr edema.  No cyanosis Skin: No petechiae / purpura   Problem List:   Active Problems:   * No active hospital problems. *   Assessment  Stacy Harrell  37 y.o. female  2 Days Post-Op  Procedure(s): LAPAROSCOPIC CHOLECYSTECTOMY WITH INTRAOPERATIVE CHOLANGIOGRAM LIVER BIOPSY  Mild cholecystitis  status post cholecystectomy.  Transaminitis undergoing hepatitis and further GI workup  Plan:  -adv diet -f/u GI & hepatitis w/u -HTN - start B blocker for now -VTE prophylaxis- SCDs, etc -mobilize as tolerated to help recovery  D/C patient from hospital when patient meets criteria:  Tolerating oral intake well Ambulating in walkways Adequate pain control without IV medications Urinating  Having flatus Cleared by GI for outpatient workup  Ardeth Sportsman, M.D., F.A.C.S. Gastrointestinal and Minimally Invasive Surgery [facility] [address], [address] [place], [place] [address] [phone] Main / Paging   02/15/2013   Results:   Labs: Results for orders placed during the hospital encounter of 02/13/13 (from the past 48 hour(s))  SURGICAL PCR SCREEN     Status: None   Collection Time    02/13/13  1:20 PM      Result Value Range   MRSA, PCR NEGATIVE  NEGATIVE   Staphylococcus aureus NEGATIVE  NEGATIVE   Comment:            The Xpert SA Assay (FDA     approved for NASAL specimens     in patients over 43 years of age),     is one component of     a comprehensive surveillance     program.  Test performance has     been validated by [employer] for patients greater     than or equal to 1  year old.     It is not intended     to diagnose infection nor to     guide or monitor treatment.  COMPREHENSIVE METABOLIC PANEL     Status: Abnormal   Collection Time    02/14/13  4:05 AM      Result Value Range   Sodium 135  135 - 145 mEq/L   Potassium 4.1  3.5 - 5.1 mEq/L   Chloride 101  96 - 112 mEq/L   CO2 24  19 - 32 mEq/L   Glucose, Bld 135 (*) 70 - 99 mg/dL   BUN 7  6 - 23 mg/dL   Creatinine, Ser 6.04 (*) 0.50 - 1.10 mg/dL   Calcium 8.7  8.4 - 54.0 mg/dL   Total Protein 6.8  6.0 - 8.3 g/dL   Albumin 3.1 (*) 3.5 - 5.2 g/dL   AST 981 (*) 0 - 37 U/L   ALT 1270 (*) 0 - 35 U/L   Alkaline Phosphatase 141 (*) 39 - 117 U/L   Total  Bilirubin 1.1  0.3 - 1.2 mg/dL   GFR calc non Af Amer >90  >90 mL/min   GFR calc Af Amer >90  >90 mL/min   Comment:            The eGFR has been calculated     using the CKD EPI equation.     This calculation has not been     validated in all clinical     situations.     eGFR's persistently     <90 mL/min signify     possible Chronic Kidney Disease.  CBC     Status: Abnormal   Collection Time    02/14/13  4:05 AM      Result Value Range   WBC 6.6  4.0 - 10.5 K/uL   RBC 5.31 (*) 3.87 - 5.11 MIL/uL   Hemoglobin 11.9 (*) 12.0 - 15.0 g/dL   HCT 19.1  47.8 - 29.5 %   MCV 70.6 (*) 78.0 - 100.0 fL   MCH 22.4 (*) 26.0 - 34.0 pg   MCHC 31.7  30.0 - 36.0 g/dL   RDW 62.1  30.8 - 65.7 %   Platelets 200  150 - 400 K/uL   Comment: SPECIMEN CHECKED FOR CLOTS     REPEATED TO VERIFY  BILIRUBIN, DIRECT     Status: Abnormal   Collection Time    02/14/13  4:05 AM      Result Value Range   Bilirubin, Direct 0.5 (*) 0.0 - 0.3 mg/dL  HIV ANTIBODY (ROUTINE TESTING)     Status: None   Collection Time    02/14/13  1:10 PM      Result Value Range   HIV NON REACTIVE  NON REACTIVE  COMPREHENSIVE METABOLIC PANEL     Status: Abnormal   Collection Time    02/15/13  5:00 AM      Result Value Range   Sodium 139  135 - 145 mEq/L   Potassium 4.0  3.5 - 5.1 mEq/L   Chloride 105  96 - 112 mEq/L   CO2 27  19 - 32 mEq/L   Glucose, Bld 94  70 - 99 mg/dL   BUN 7  6 - 23 mg/dL   Creatinine, Ser 8.46  0.50 - 1.10 mg/dL   Calcium 8.6  8.4 - 96.2 mg/dL   Total Protein 6.1  6.0 - 8.3 g/dL   Albumin 2.8 (*) 3.5 - 5.2 g/dL  AST 306 (*) 0 - 37 U/L   ALT 761 (*) 0 - 35 U/L   Alkaline Phosphatase 124 (*) 39 - 117 U/L   Total Bilirubin 0.5  0.3 - 1.2 mg/dL   GFR calc non Af Amer >90  >90 mL/min   GFR calc Af Amer >90  >90 mL/min   Comment:            The eGFR has been calculated     using the CKD EPI equation.     This calculation has not been     validated in all clinical     situations.     eGFR's  persistently     <90 mL/min signify     possible Chronic Kidney Disease.  CBC     Status: Abnormal   Collection Time    02/15/13  5:00 AM      Result Value Range   WBC 6.1  4.0 - 10.5 K/uL   RBC 5.03  3.87 - 5.11 MIL/uL   Hemoglobin 11.2 (*) 12.0 - 15.0 g/dL   HCT 40.3 (*) 47.4 - 25.9 %   MCV 71.2 (*) 78.0 - 100.0 fL   MCH 22.3 (*) 26.0 - 34.0 pg   MCHC 31.3  30.0 - 36.0 g/dL   RDW 56.3  87.5 - 64.3 %   Platelets 183  150 - 400 K/uL  PROTIME-INR     Status: None   Collection Time    02/15/13  5:00 AM      Result Value Range   Prothrombin Time 13.9  11.6 - 15.2 seconds   INR 1.08  0.00 - 1.49    Imaging / Studies: Dg Cholangiogram Operative  02/13/2013  *RADIOLOGY REPORT*  Clinical Data:   Cholecystectomy, gallstones  INTRAOPERATIVE CHOLANGIOGRAM  Technique:  Cholangiographic images from the C-arm fluoroscopic device were submitted for interpretation post-operatively.  Please see the procedural report for the amount of contrast and the fluoroscopy time utilized.  Comparison:  Preoperative ultrasound and CT 02/13/2013  Findings:  Artifact from support apparatus overlies the right upper quadrant.  There is good opacification of the normal caliber common duct and proximal intrahepatic ducts without filling defect.  There is prompt excretion into nondilated duodenum.  IMPRESSION: No filling defect or ductal dilatation at intraoperative cholangiogram.   Original Report Authenticated By: Christiana Pellant, M.D.     Medications / Allergies: per chart  Antibiotics: Anti-infectives   Start     Dose/Rate Route Frequency Ordered Stop   02/13/13 1845  Ampicillin-Sulbactam (UNASYN) 3 g in sodium chloride 0.9 % 100 mL IVPB     3 g 100 mL/hr over 60 Minutes Intravenous Every 6 hours 02/13/13 1833 02/14/13 0644   02/13/13 1400  piperacillin-tazobactam (ZOSYN) IVPB 3.375 g  Status:  Discontinued     3.375 g 12.5 mL/hr over 240 Minutes Intravenous 3 times per day 02/13/13 1239 02/13/13 1241    02/13/13 0915  Ampicillin-Sulbactam (UNASYN) 3 g in sodium chloride 0.9 % 100 mL IVPB     3 g 100 mL/hr over 60 Minutes Intravenous  Once 02/13/13 0908 02/13/13 1042

## 2013-02-17 ENCOUNTER — Encounter: Payer: Self-pay | Admitting: Gastroenterology

## 2013-02-17 LAB — HEPATITIS PANEL, ACUTE
Hep A IgM: NEGATIVE
Hep B C IgM: NEGATIVE

## 2013-02-18 LAB — ANTI-SMOOTH MUSCLE ANTIBODY, IGG: F-Actin IgG: 10 U (ref <20)

## 2013-02-24 ENCOUNTER — Other Ambulatory Visit: Payer: Self-pay

## 2013-02-27 NOTE — Discharge Summary (Signed)
  Physician Discharge Summary  Patient ID: Stacy Harrell MRN: [id number] DOB/AGE: 1976/02/09 37 y.o.  Admit date: 02/13/2013 Discharge date: 02/27/2013  Admitting Diagnosis: Acute Cholecystitis   Discharge Diagnosis Mild cholecystitis status post cholecystectomy.  Transaminitis undergoing hepatitis and further GI workup  Consultants None  Procedures Laparoscopic Cholecystectomy with [facility]  Hospital Course 36 yr old female who presented to [facility] with abdominal pain.  Workup showed elevated LFTs and imaging suggestive of acute cholecystitis.  Patient was admitted and underwent procedure listed above.  Tolerated procedure well and was transferred to the floor.  Post-op her LFTs continued to be elevated out of proportion to her GB inflammation.  Hep panel and GI consult were obtained.  A wedge liver biopsy was performed during surgery as well.  Diet was advanced as tolerated.  On POD#3, the patient was voiding well, tolerating diet, ambulating well, pain well controlled, vital signs stable, incisions c/d/i and felt stable for discharge home.  GI oked the patient to go home and continue workup as an outpatient.  Patient will follow up in our office in 2 weeks and knows to call with questions or concerns.  Followup with GI.    Medication List    TAKE these medications       multivitamin with minerals Tabs  Take 1 tablet by mouth daily.     naproxen 500 MG tablet  Commonly known as:  NAPROSYN  Take 1 tablet (500 mg total) by mouth 2 (two) times daily with a meal.     naproxen sodium 220 MG tablet  Commonly known as:  ANAPROX  Take 220 mg by mouth 2 (two) times daily as needed (for pain.).     oxyCODONE 5 MG immediate release tablet  Commonly known as:  Oxy IR/ROXICODONE  Take 1-2 tablets (5-10 mg total) by mouth every 4 (four) hours as needed.             Follow-up Information   Follow up with ROSENBOWER,TODD J, MD. Schedule an appointment as soon as possible for a visit in 3  weeks.   Contact information:   [address] [place] [address] [phone]       Follow up with Rob Bunting, MD. Schedule an appointment as soon as possible for a visit in 1 week.   Contact information:   [address] [place] [address] [phone]       Signed: Denny Levy [facility] [phone]  02/27/2013, 8:11 AM

## 2013-03-05 ENCOUNTER — Encounter (INDEPENDENT_AMBULATORY_CARE_PROVIDER_SITE_OTHER): Payer: Self-pay | Admitting: General Surgery

## 2013-03-05 ENCOUNTER — Ambulatory Visit (INDEPENDENT_AMBULATORY_CARE_PROVIDER_SITE_OTHER): Payer: BC Managed Care – PPO | Admitting: General Surgery

## 2013-03-05 VITALS — BP 142/74 | HR 68 | Temp 98.2°F | Resp 16 | Ht 66.0 in | Wt 180.4 lb

## 2013-03-05 DIAGNOSIS — Z9889 Other specified postprocedural states: Secondary | ICD-10-CM

## 2013-03-05 NOTE — Patient Instructions (Signed)
Low fat diet. Activities as tolerated. Avoid Tylenol.

## 2013-03-05 NOTE — Progress Notes (Signed)
Procedure:  Laparoscopic cholecystectomy with cholangiogram and liver biopsy  Date:  02/13/2013  Pathology: Cholelithiasis with chronic cholecystitis. Portal inflammation.  History:  She is here for her first postoperative visit.  Her hepatitis screen was negative. She had the abnormal liver biopsy which demonstrated inflammatory changes around the portal area. This could explain the liver function test elevation I suspect.  She is doing well from her surgery.  Exam: General- Is in NAD. Abdomen-soft, incisions clean and intact.  Assessment:  Doing well from laparoscopic cholecystectomy. She is due to see Dr. Christella Hartigan regarding her elevation of liver function tests.  Plan:  Low fat diet encouraged. Activities as tolerated. I advised her to avoid Tylenol. Return visit as needed.

## 2013-03-13 ENCOUNTER — Other Ambulatory Visit (INDEPENDENT_AMBULATORY_CARE_PROVIDER_SITE_OTHER): Payer: BC Managed Care – PPO

## 2013-03-13 DIAGNOSIS — R7989 Other specified abnormal findings of blood chemistry: Secondary | ICD-10-CM

## 2013-03-13 LAB — COMPREHENSIVE METABOLIC PANEL
ALT: 22 U/L (ref 0–35)
AST: 20 U/L (ref 0–37)
Albumin: 3.7 g/dL (ref 3.5–5.2)
Alkaline Phosphatase: 82 U/L (ref 39–117)
Calcium: 9.1 mg/dL (ref 8.4–10.5)
Chloride: 104 mEq/L (ref 96–112)
Creatinine, Ser: 0.7 mg/dL (ref 0.4–1.2)
Potassium: 3.8 mEq/L (ref 3.5–5.1)

## 2013-03-14 ENCOUNTER — Encounter: Payer: Self-pay | Admitting: Gastroenterology

## 2013-03-14 ENCOUNTER — Ambulatory Visit (INDEPENDENT_AMBULATORY_CARE_PROVIDER_SITE_OTHER): Payer: BC Managed Care – PPO | Admitting: Gastroenterology

## 2013-03-14 VITALS — BP 110/76 | HR 76 | Ht 65.0 in | Wt 180.2 lb

## 2013-03-14 DIAGNOSIS — R7989 Other specified abnormal findings of blood chemistry: Secondary | ICD-10-CM

## 2013-03-14 NOTE — Progress Notes (Signed)
Review of pertinent gastrointestinal problems: 1. Elevated liver tests around time of acute cholecystitis, lap chol (02/2013), IOC was normal. transaminases in several hundred range: 02/2013: hep A, B, C testing all neg, ANA neg; surgical biopsy showed acute inflammation, portal.  LFTs completely normalized by time after 3-4 weeks.   HPI: This is a   very pleasant 37 year old woman whom I last saw when she was hospitalized about a month ago.  She is here with her husband today. She had acute cholecystitis And has recovered well from her surgery. Her liver tests were quite a bit more elevated than is typical for acute cholecystitis   complete metabolic profile yesterday was normal    Past Medical History  Diagnosis Date  . Back pain   . Hypertension   . Acute cholecystitis s/p lap chole 02/13/2013 02/15/2013  . Transaminitis s/p liver Bx Apr2014 02/15/2013  . Cholecystitis     Past Surgical History  Procedure Laterality Date  . Cholecystectomy N/A 02/13/2013    Procedure: LAPAROSCOPIC CHOLECYSTECTOMY WITH INTRAOPERATIVE CHOLANGIOGRAM;  Surgeon: Adolph Pollack, MD;  Location: [facility];  Service: General;  Laterality: N/A;  . Liver biopsy  02/13/2013    Procedure: LIVER BIOPSY;  Surgeon: Adolph Pollack, MD;  Location: [facility];  Service: General;;    Current Outpatient Prescriptions  Medication Sig Dispense Refill  . Dietary Management Product (TREPADONE PO) Take 1 capsule by mouth as needed.      . meloxicam (MOBIC) 7.5 MG tablet Take 7.5 mg by mouth 2 (two) times daily.      . methocarbamol (ROBAXIN) 500 MG tablet Take 500 mg by mouth 2 (two) times daily as needed.      . Multiple Vitamin (MULTIVITAMIN WITH MINERALS) TABS Take 1 tablet by mouth daily.      . orphenadrine (NORFLEX) 100 MG tablet Take 100 mg by mouth 2 (two) times daily.      . traMADol (ULTRAM) 50 MG tablet Take 50 mg by mouth 2 (two) times daily.       No current facility-administered medications for this visit.     Allergies as of 03/14/2013  . (No Known Allergies)    Family History  Problem Relation Age of Onset  . Diabetes Father   . Hypertension Father   . Hypertension Mother   . Hypertension Sister   . Hypertension Brother     History   Social History  . Marital Status: Married    Spouse Name: N/A    Number of Children: 2  . Years of Education: N/A   Occupational History  . Not on file.   Social History Main Topics  . Smoking status: Never Smoker   . Smokeless tobacco: Never Used  . Alcohol Use: No  . Drug Use: No  . Sexually Active: No   Other Topics Concern  . Not on file   Social History Narrative  . No narrative on file      Physical Exam: Ht 5\' 5"  (1.651 m)  Wt 180 lb 4 oz (81.761 kg)  BMI 30 kg/m2  LMP 01/18/2013 Constitutional: generally well-appearing Psychiatric: alert and oriented x3 Abdomen: soft, nontender, nondistended, no obvios ascites     Assessment and plan: 37 y.o. female with acute cholecystitis, reactive hepatitis   her liver tests have completely normalized. Acute hepatitis panel, autoimmune hepatitis markers were all negative. She needs no further testing for her liver. She knows to call here if she has any further questions or concerns

## 2013-03-14 NOTE — Patient Instructions (Addendum)
Your liver was irritated last month because your gallbladder was sick.  Liver testing yesterday was completley normal. No further tests are needed. Call if you have any new problems.

## 2013-09-12 IMAGING — CT CT ABD-PELV W/ CM
2 of 4 series · 16 of 46 positions shown, 18 images · IV contrast (APPLIED)
Comparison: None.

CLINICAL DATA: Epigastric abdominal pain.

CT ABDOMEN AND PELVIS WITH CONTRAST
TECHNIQUE: Multidetector CT imaging of the abdomen and pelvis was
performed following the standard protocol during bolus
administration of intravenous contrast.
Contrast: 100 mL of Omnipaque 300 IV contrast

[Series 2: abd/pelvis 5.0 b31f · axial · 0.82mm/px · z∈[+698,+1098]mm · 13 of 88 slices shown, 15 images]
[im 4/88  soft-tissue]
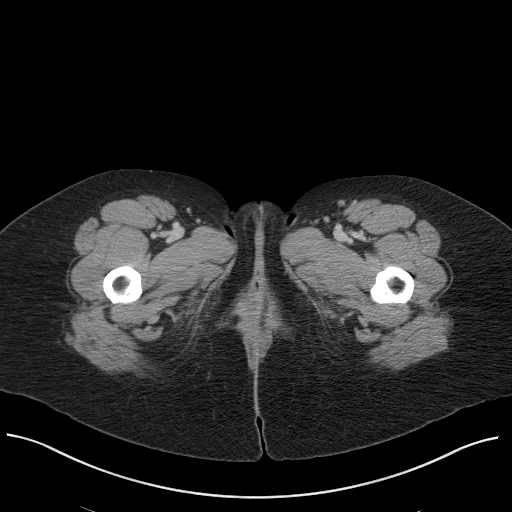
[im 4/88  bone]
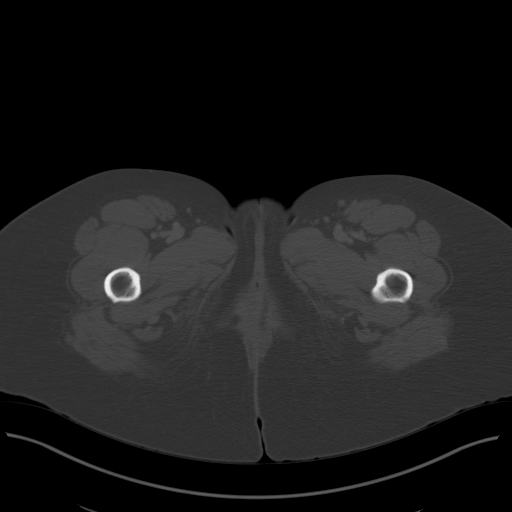
[im 11/88  soft-tissue]
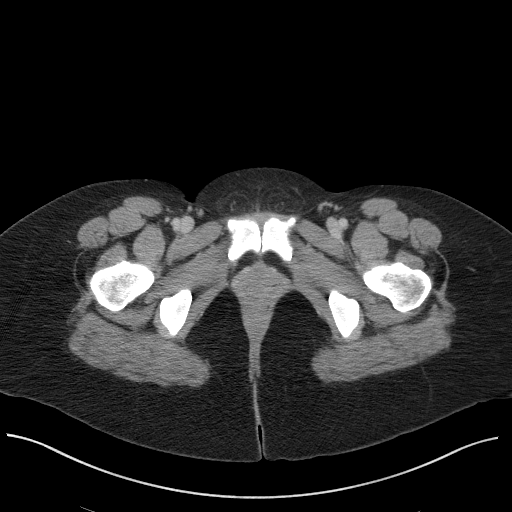
[im 19/88  soft-tissue]
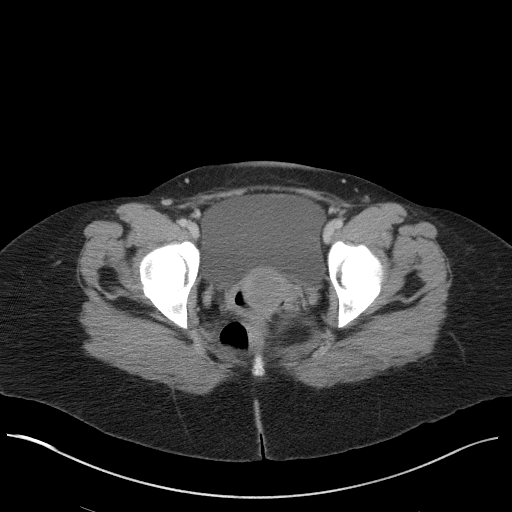
[im 26/88  soft-tissue]
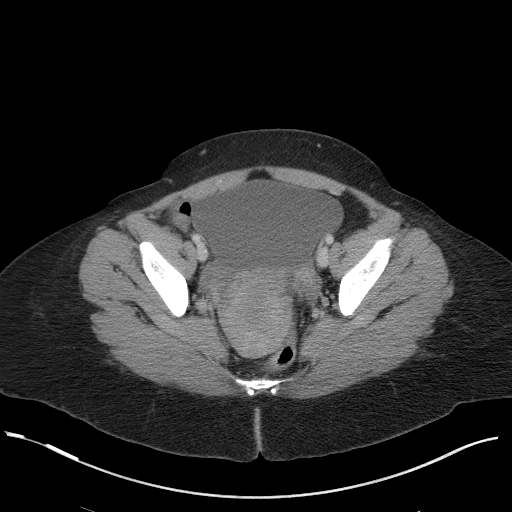
[im 30/88  soft-tissue]
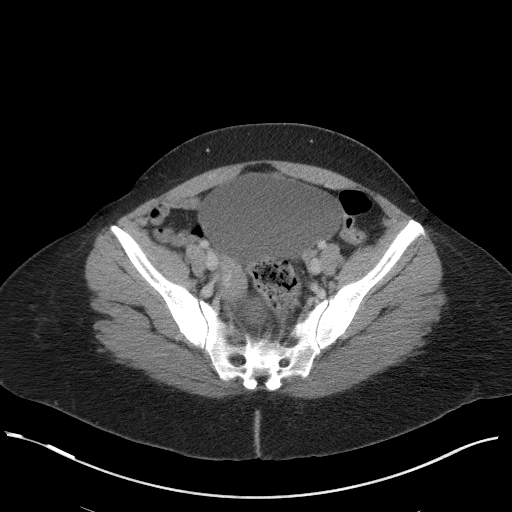
[im 37/88  soft-tissue]
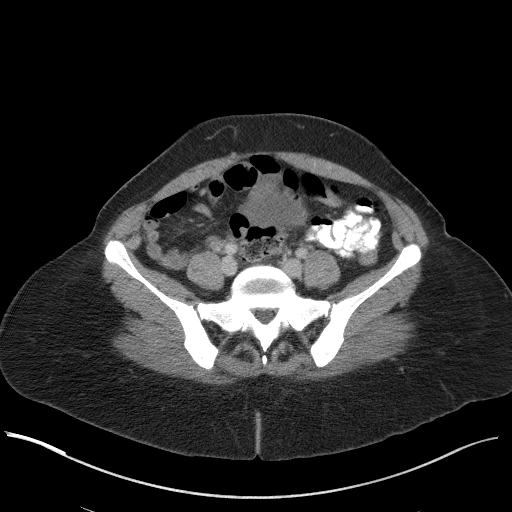
[im 44/88  soft-tissue]
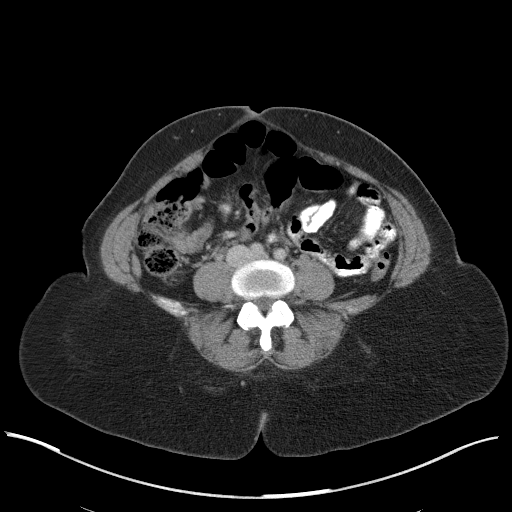
[im 51/88  soft-tissue]
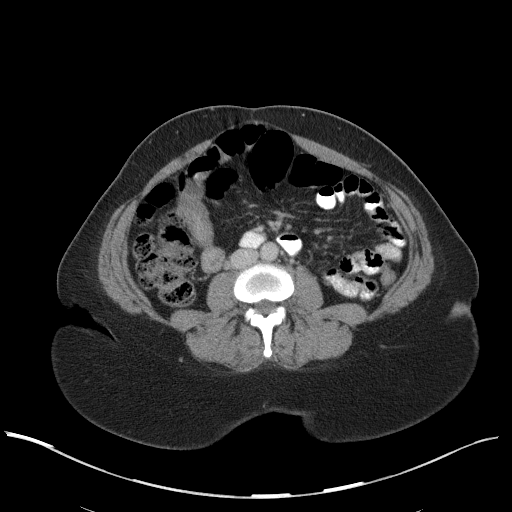
[im 59/88  soft-tissue]
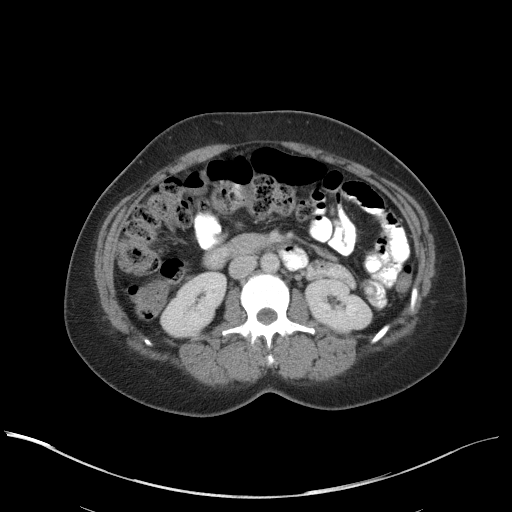
[im 59/88  bone]
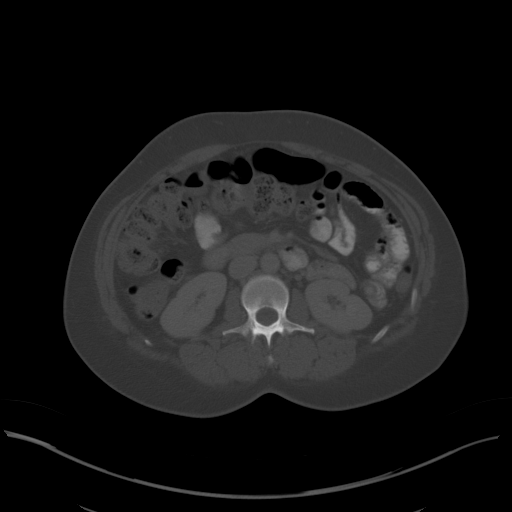
[im 62/88  soft-tissue]
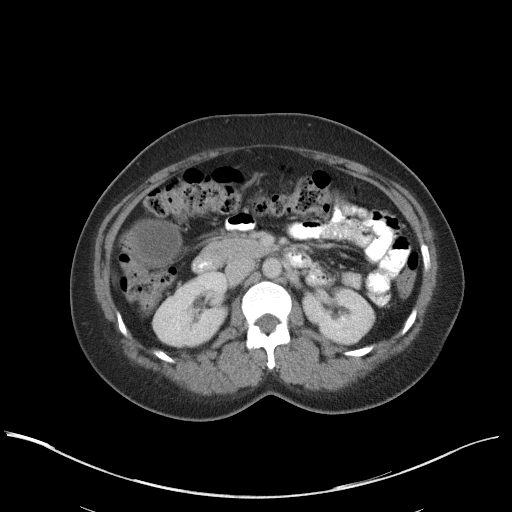
[im 69/88  soft-tissue]
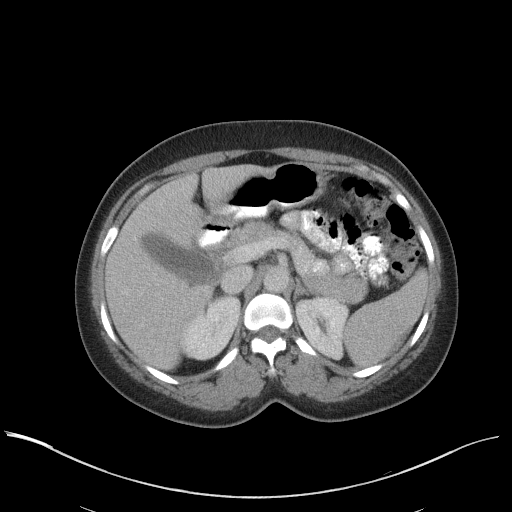
[im 77/88  soft-tissue]
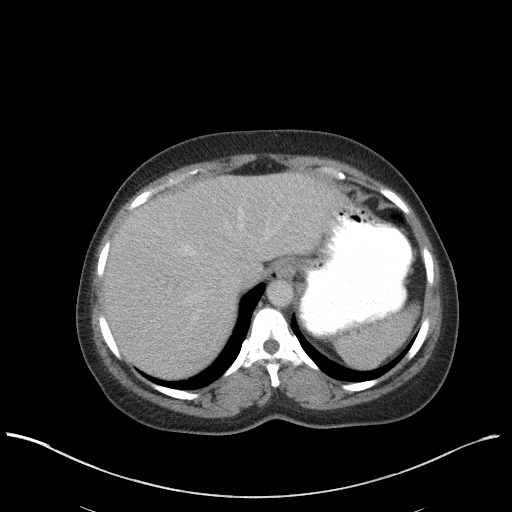
[im 84/88  soft-tissue]
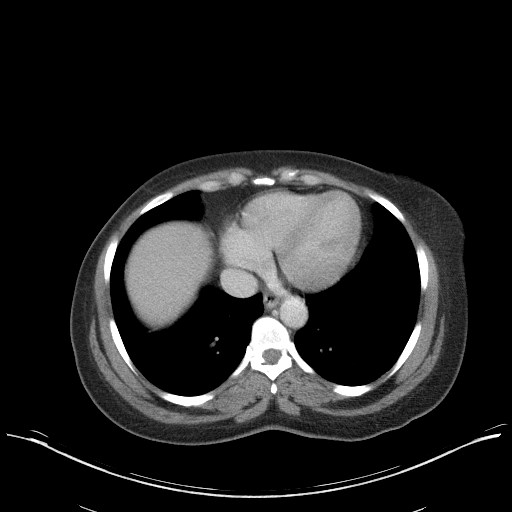

[Series 5: abd/pelvis 3.0 coronal · coronal · 0.88mm/px · 3 of 91 slices shown]
[im 31/91  soft-tissue]
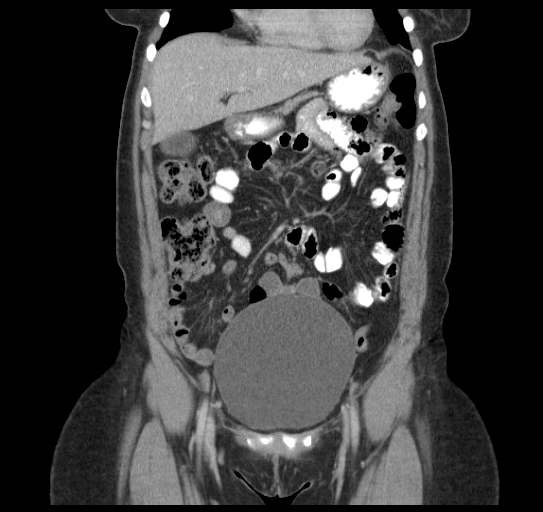
[im 41/91  soft-tissue]
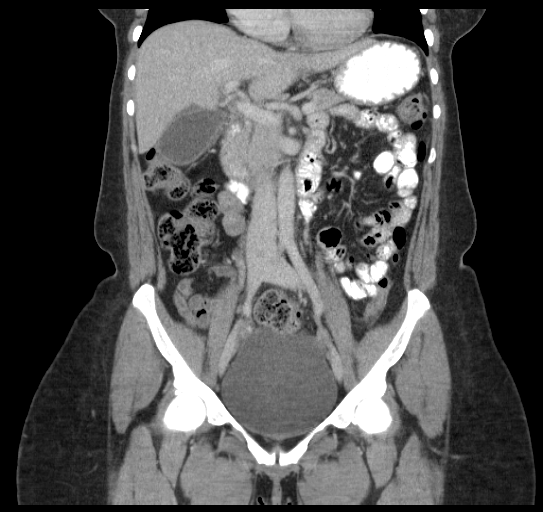
[im 51/91  soft-tissue]
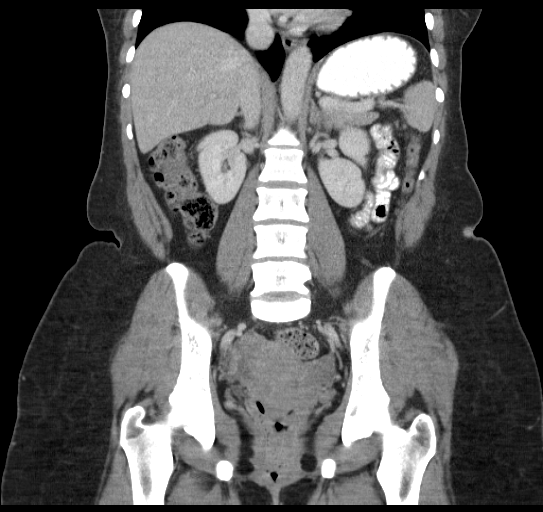

[16 of 46 positions shown; findings below may reference images not displayed]

FINDINGS: The visualized lung bases are clear.

There is mild soft tissue inflammation about the gallbladder, with
mild apparent gallbladder wall thickening.  Mildly increased
attenuation layering within the gallbladder may reflect sludge.

The liver is unremarkable in appearance.  A small amount of high-
attenuation fluid is noted surrounding the spleen, which could
reflect trace blood.  Would correlate for evidence of recent
traumatic injury.

The pancreas and adrenal glands are unremarkable in appearance.

The kidneys are unremarkable in appearance.  There is no evidence
of hydronephrosis.  No renal or ureteral stones are seen.  No
perinephric stranding is appreciated.

No free fluid is identified.  The small bowel is unremarkable in
appearance.  The stomach is within normal limits.  No acute
vascular abnormalities are seen.

The appendix is normal in caliber and contains air, without
evidence for appendicitis.  The colon is unremarkable in
appearance.

The bladder is moderately distended and grossly unremarkable in
appearance.  The uterus is grossly unremarkable, though difficult
to fully assess.  The ovaries are relatively symmetric; no
suspicious adnexal masses are seen.  No inguinal lymphadenopathy is
seen.

No acute osseous abnormalities are identified.
IMPRESSION: 1.  Mild soft tissue inflammation about the gallbladder, with mild
apparent gallbladder wall thickening.  Likely sludge noted within
the gallbladder.  Would correlate for evidence of cholecystitis,
and consider further imaging as deemed clinically appropriate.
2.  Small amount of high-attenuation fluid noted surrounding the
spleen; this is suspicious for trace blood.  Would correlate for
evidence of recent traumatic injury.  This could be relatively
chronic in nature.

to Dr. Daverson Ziehlsdorff, who verbally acknowledged these results.

## 2013-10-23 LAB — OB RESULTS CONSOLE HEPATITIS B SURFACE ANTIGEN: Hepatitis B Surface Ag: NEGATIVE

## 2013-10-23 LAB — OB RESULTS CONSOLE HIV ANTIBODY (ROUTINE TESTING): HIV: NONREACTIVE

## 2013-10-23 LAB — OB RESULTS CONSOLE HGB/HCT, BLOOD
HCT: 10 %
HEMOGLOBIN: 36.8 g/dL

## 2013-10-23 LAB — OB RESULTS CONSOLE RPR: RPR: NONREACTIVE

## 2013-10-23 LAB — OB RESULTS CONSOLE ABO/RH: RH Type: POSITIVE

## 2013-10-23 LAB — OB RESULTS CONSOLE RUBELLA ANTIBODY, IGM: RUBELLA: IMMUNE

## 2013-10-23 LAB — OB RESULTS CONSOLE PLATELET COUNT: PLATELETS: 200 10*3/uL

## 2013-10-23 LAB — OB RESULTS CONSOLE ANTIBODY SCREEN: ANTIBODY SCREEN: NEGATIVE

## 2013-11-06 NOTE — L&D Delivery Note (Signed)
Delivery Note At 8:05 AM a viable and healthy female was delivered via Vaginal, Spontaneous Delivery (Presentation: right; occiput anterior  ).  APGAR: 9, 9; weight pending.   Placenta status: spontaneous, intact.  Cord: 3V   Anesthesia:  Stadol Episiotomy: None Lacerations: None Suture Repair: None Est. Blood Loss (mL): 300   Mom to postpartum.  Baby to Couplet care / Skin to Skin.  ROSS,KENDRA H. 05/04/2014, 8:25 AM

## 2014-04-10 LAB — OB RESULTS CONSOLE GBS: STREP GROUP B AG: NEGATIVE

## 2014-05-03 ENCOUNTER — Encounter (HOSPITAL_COMMUNITY): Payer: Self-pay | Admitting: *Deleted

## 2014-05-03 ENCOUNTER — Inpatient Hospital Stay (HOSPITAL_COMMUNITY)
Admission: AD | Admit: 2014-05-03 | Discharge: 2014-05-06 | DRG: 774 | Disposition: A | Discharge status: Home / Self Care | Payer: BC Managed Care – PPO | Source: Ambulatory Visit | Attending: Obstetrics and Gynecology | Admitting: Obstetrics and Gynecology

## 2014-05-03 DIAGNOSIS — O1002 Pre-existing essential hypertension complicating childbirth: Principal | ICD-10-CM | POA: Diagnosis present

## 2014-05-03 DIAGNOSIS — O479 False labor, unspecified: Secondary | ICD-10-CM | POA: Diagnosis present

## 2014-05-03 DIAGNOSIS — IMO0001 Reserved for inherently not codable concepts without codable children: Secondary | ICD-10-CM

## 2014-05-03 LAB — CBC
HCT: 36.3 % (ref 36.0–46.0)
Hemoglobin: 11.3 g/dL — ABNORMAL LOW (ref 12.0–15.0)
MCH: 21.3 pg — ABNORMAL LOW (ref 26.0–34.0)
MCHC: 31.1 g/dL (ref 30.0–36.0)
MCV: 68.5 fL — ABNORMAL LOW (ref 78.0–100.0)
Platelets: 177 10*3/uL (ref 150–400)
RBC: 5.3 MIL/uL — ABNORMAL HIGH (ref 3.87–5.11)
RDW: 15.9 % — AB (ref 11.5–15.5)
WBC: 8.2 10*3/uL (ref 4.0–10.5)

## 2014-05-03 LAB — COMPREHENSIVE METABOLIC PANEL
ALT: 8 U/L (ref 0–35)
AST: 19 U/L (ref 0–37)
Albumin: 2.7 g/dL — ABNORMAL LOW (ref 3.5–5.2)
Alkaline Phosphatase: 167 U/L — ABNORMAL HIGH (ref 39–117)
BILIRUBIN TOTAL: 0.2 mg/dL — AB (ref 0.3–1.2)
BUN: 8 mg/dL (ref 6–23)
CHLORIDE: 103 meq/L (ref 96–112)
CO2: 20 meq/L (ref 19–32)
CREATININE: 0.6 mg/dL (ref 0.50–1.10)
Calcium: 9.2 mg/dL (ref 8.4–10.5)
GFR calc Af Amer: >90 mL/min (ref >90)
Glucose, Bld: 74 mg/dL (ref 70–99)
Potassium: 4.4 mEq/L (ref 3.7–5.3)
Sodium: 137 mEq/L (ref 137–147)
Total Protein: 6.3 g/dL (ref 6.0–8.3)

## 2014-05-03 LAB — URINALYSIS, ROUTINE W REFLEX MICROSCOPIC
BILIRUBIN URINE: NEGATIVE
GLUCOSE, UA: NEGATIVE mg/dL
KETONES UR: 40 mg/dL — AB
Nitrite: NEGATIVE
PROTEIN: NEGATIVE mg/dL
Specific Gravity, Urine: 1.02 (ref 1.005–1.030)
Urobilinogen, UA: 0.2 mg/dL (ref 0.0–1.0)
pH: 6 (ref 5.0–8.0)

## 2014-05-03 LAB — PROTEIN / CREATININE RATIO, URINE
CREATININE, URINE: 95.01 mg/dL
Protein Creatinine Ratio: 0.17 — ABNORMAL HIGH (ref 0.00–0.15)
Total Protein, Urine: 16.1 mg/dL

## 2014-05-03 LAB — URINE MICROSCOPIC-ADD ON

## 2014-05-03 LAB — RPR

## 2014-05-03 LAB — URIC ACID: Uric Acid, Serum: 7.4 mg/dL — ABNORMAL HIGH (ref 2.4–7.0)

## 2014-05-03 MED ORDER — ACETAMINOPHEN 325 MG PO TABS: 650.0000 mg | ORAL_TABLET | ORAL | Status: DC | PRN

## 2014-05-03 MED ORDER — BUTORPHANOL TARTRATE 1 MG/ML IJ SOLN
2.0000 mg | INTRAMUSCULAR | Status: DC | PRN
  Administered 2014-05-03 – 2014-05-04 (×4): 2 mg via INTRAVENOUS
  Filled 2014-05-03 (×4): qty 2

## 2014-05-03 MED ORDER — TERBUTALINE SULFATE 1 MG/ML IJ SOLN: 0.2500 mg | Freq: Once | INTRAMUSCULAR | Status: AC | PRN

## 2014-05-03 MED ORDER — ONDANSETRON HCL 4 MG/2ML IJ SOLN: 4.0000 mg | Freq: Four times a day (QID) | INTRAMUSCULAR | Status: DC | PRN

## 2014-05-03 MED ORDER — LACTATED RINGERS IV SOLN
INTRAVENOUS | Status: DC
  Administered 2014-05-03 – 2014-05-04 (×3): via INTRAVENOUS

## 2014-05-03 MED ORDER — OXYTOCIN 40 UNITS IN LACTATED RINGERS INFUSION - SIMPLE MED
1.0000 m[IU]/min | INTRAVENOUS | Status: DC
  Administered 2014-05-03: 2 m[IU]/min via INTRAVENOUS
  Filled 2014-05-03: qty 1000

## 2014-05-03 MED ORDER — OXYTOCIN 40 UNITS IN LACTATED RINGERS INFUSION - SIMPLE MED: 62.5000 mL/h | INTRAVENOUS | Status: DC

## 2014-05-03 MED ORDER — IBUPROFEN 600 MG PO TABS
600.0000 mg | ORAL_TABLET | Freq: Four times a day (QID) | ORAL | Status: DC | PRN
  Administered 2014-05-04: 600 mg via ORAL
  Filled 2014-05-03: qty 1

## 2014-05-03 MED ORDER — OXYCODONE-ACETAMINOPHEN 5-325 MG PO TABS: 1.0000 | ORAL_TABLET | ORAL | Status: DC | PRN

## 2014-05-03 MED ORDER — LIDOCAINE HCL (PF) 1 % IJ SOLN
30.0000 mL | INTRAMUSCULAR | Status: DC | PRN
Start: 2014-05-03 — End: 2014-05-04
  Filled 2014-05-03: qty 30

## 2014-05-03 MED ORDER — OXYTOCIN BOLUS FROM INFUSION: 500.0000 mL | INTRAVENOUS | Status: DC

## 2014-05-03 MED ORDER — LACTATED RINGERS IV SOLN: 500.0000 mL | INTRAVENOUS | Status: DC | PRN

## 2014-05-03 MED ORDER — FLEET ENEMA 7-19 GM/118ML RE ENEM: 1.0000 | ENEMA | RECTAL | Status: DC | PRN

## 2014-05-03 MED ORDER — CITRIC ACID-SODIUM CITRATE 334-500 MG/5ML PO SOLN
30.0000 mL | ORAL | Status: DC | PRN
Start: 2014-05-03 — End: 2014-05-04

## 2014-05-03 NOTE — Progress Notes (Signed)
Dr Dareen Pianoanderson on phone - aware of pts urine protein/creatine results - no new orders

## 2014-05-03 NOTE — Progress Notes (Signed)
Results for orders placed during the hospital encounter of 05/03/14 (from the past 24 hour(s))  CBC     Status: Abnormal   Collection Time    05/03/14  1:10 PM      Result Value Ref Range   WBC 8.2  4.0 - 10.5 K/uL   RBC 5.30 (*) 3.87 - 5.11 MIL/uL   Hemoglobin 11.3 (*) 12.0 - 15.0 g/dL   HCT 16.136.3  09.636.0 - 04.546.0 %   MCV 68.5 (*) 78.0 - 100.0 fL   MCH 21.3 (*) 26.0 - 34.0 pg   MCHC 31.1  30.0 - 36.0 g/dL   RDW 40.915.9 (*) 81.111.5 - 91.415.5 %   Platelets 177  150 - 400 K/uL  URIC ACID     Status: Abnormal   Collection Time    05/03/14  1:10 PM      Result Value Ref Range   Uric Acid, Serum 7.4 (*) 2.4 - 7.0 mg/dL  COMPREHENSIVE METABOLIC PANEL     Status: Abnormal   Collection Time    05/03/14  1:10 PM      Result Value Ref Range   Sodium 137  137 - 147 mEq/L   Potassium 4.4  3.7 - 5.3 mEq/L   Chloride 103  96 - 112 mEq/L   CO2 20  19 - 32 mEq/L   Glucose, Bld 74  70 - 99 mg/dL   BUN 8  6 - 23 mg/dL   Creatinine, Ser 7.820.60  0.50 - 1.10 mg/dL   Calcium 9.2  8.4 - 95.610.5 mg/dL   Total Protein 6.3  6.0 - 8.3 g/dL   Albumin 2.7 (*) 3.5 - 5.2 g/dL   AST 19  0 - 37 U/L   ALT 8  0 - 35 U/L   Alkaline Phosphatase 167 (*) 39 - 117 U/L   Total Bilirubin 0.2 (*) 0.3 - 1.2 mg/dL   GFR calc non Af Amer >90  >90 mL/min   GFR calc Af Amer >90  >90 mL/min   Blood work negative for signs of preeclampsia.

## 2014-05-03 NOTE — H&P (Signed)
38 y.o. [id number]  G3P2002 comes in c/o labor.  Otherwise has good fetal movement and no bleeding.  Past Medical History  Diagnosis Date  . Back pain   . Acute cholecystitis s/p lap chole 02/13/2013 02/15/2013  . Transaminitis s/p liver Bx Apr2014 02/15/2013  . Cholecystitis   . Hypertension     not currently on meds    Past Surgical History  Procedure Laterality Date  . Cholecystectomy N/A 02/13/2013    Procedure: LAPAROSCOPIC CHOLECYSTECTOMY WITH INTRAOPERATIVE CHOLANGIOGRAM;  Surgeon: Adolph Pollackodd J Rosenbower, MD;  Location: [facility];  Service: General;  Laterality: N/A;  . Liver biopsy  02/13/2013    Procedure: LIVER BIOPSY;  Surgeon: Adolph Pollackodd J Rosenbower, MD;  Location: [facility];  Service: General;;    OB History  Gravida Para Term Preterm AB SAB TAB Ectopic Multiple Living  3 2 2  0 0 0 0 0 0 2    # Outcome Date GA Lbr Len/2nd Weight Sex Delivery Anes PTL Lv  3 CUR           2 TRM           1 TRM               History   Social History  . Marital Status: Married    Spouse Name: N/A    Number of Children: 2  . Years of Education: N/A   Occupational History  . Not on file.   Social History Main Topics  . Smoking status: Never Smoker   . Smokeless tobacco: Never Used  . Alcohol Use: No  . Drug Use: No  . Sexual Activity: No   Other Topics Concern  . Not on file   Social History Narrative  . No narrative on file   Review of patient's allergies indicates no known allergies.    Prenatal Transfer Tool  Maternal Diabetes: No Genetic Screening: Normal Maternal Ultrasounds/Referrals: Normal Fetal Ultrasounds or other Referrals:  None Maternal Substance Abuse:  No Significant Maternal Medications:  None Significant Maternal Lab Results: None  Other PNC: uncomplicated.    Filed Vitals:   05/03/14 1303  BP: 145/85  Pulse: 74  Temp:   Resp:      Lungs/Cor:  NAD Abdomen:  soft, gravid Ex:  no cords, erythema SVE:  4/80/-2 per nurse FHTs:  120s, good STV, NST R Toco:  q  4-5   A/P   Term labor with elevated BPs- [facility] in labor.  Will get labs and Rx with MgSo4 only if severe signs, no sx.  GBS neg.  Hersey Maclellan A

## 2014-05-04 ENCOUNTER — Encounter (HOSPITAL_COMMUNITY): Payer: Self-pay | Admitting: *Deleted

## 2014-05-04 DIAGNOSIS — O1002 Pre-existing essential hypertension complicating childbirth: Secondary | ICD-10-CM | POA: Diagnosis not present

## 2014-05-04 DIAGNOSIS — O479 False labor, unspecified: Secondary | ICD-10-CM | POA: Diagnosis not present

## 2014-05-04 MED ORDER — IBUPROFEN 600 MG PO TABS
600.0000 mg | ORAL_TABLET | Freq: Four times a day (QID) | ORAL | Status: DC
  Administered 2014-05-04 – 2014-05-06 (×7): 600 mg via ORAL
  Filled 2014-05-04 (×8): qty 1

## 2014-05-04 MED ORDER — SIMETHICONE 80 MG PO CHEW: 80.0000 mg | CHEWABLE_TABLET | ORAL | Status: DC | PRN

## 2014-05-04 MED ORDER — ZOLPIDEM TARTRATE 5 MG PO TABS: 5.0000 mg | ORAL_TABLET | Freq: Every evening | ORAL | Status: DC | PRN

## 2014-05-04 MED ORDER — ONDANSETRON HCL 4 MG PO TABS: 4.0000 mg | ORAL_TABLET | ORAL | Status: DC | PRN

## 2014-05-04 MED ORDER — TETANUS-DIPHTH-ACELL PERTUSSIS 5-2.5-18.5 LF-MCG/0.5 IM SUSP
0.5000 mL | Freq: Once | INTRAMUSCULAR | Status: AC
  Administered 2014-05-05: 0.5 mL via INTRAMUSCULAR
  Filled 2014-05-04: qty 0.5

## 2014-05-04 MED ORDER — DIBUCAINE 1 % RE OINT: 1.0000 "application " | TOPICAL_OINTMENT | RECTAL | Status: DC | PRN

## 2014-05-04 MED ORDER — LANOLIN HYDROUS EX OINT: TOPICAL_OINTMENT | CUTANEOUS | Status: DC | PRN

## 2014-05-04 MED ORDER — ONDANSETRON HCL 4 MG/2ML IJ SOLN: 4.0000 mg | INTRAMUSCULAR | Status: DC | PRN

## 2014-05-04 MED ORDER — SENNOSIDES-DOCUSATE SODIUM 8.6-50 MG PO TABS
2.0000 | ORAL_TABLET | ORAL | Status: DC
  Administered 2014-05-04 – 2014-05-05 (×2): 2 via ORAL
  Filled 2014-05-04 (×2): qty 2

## 2014-05-04 MED ORDER — DIPHENHYDRAMINE HCL 25 MG PO CAPS: 25.0000 mg | ORAL_CAPSULE | Freq: Four times a day (QID) | ORAL | Status: DC | PRN

## 2014-05-04 MED ORDER — OXYCODONE-ACETAMINOPHEN 5-325 MG PO TABS
1.0000 | ORAL_TABLET | ORAL | Status: DC | PRN
  Administered 2014-05-04 – 2014-05-05 (×5): 1 via ORAL
  Administered 2014-05-05: 2 via ORAL
  Administered 2014-05-05 – 2014-05-06 (×2): 1 via ORAL
  Filled 2014-05-04 (×2): qty 1
  Filled 2014-05-04: qty 2
  Filled 2014-05-04 (×5): qty 1

## 2014-05-04 MED ORDER — PRENATAL MULTIVITAMIN CH
1.0000 | ORAL_TABLET | Freq: Every day | ORAL | Status: DC
  Administered 2014-05-06: 1 via ORAL
  Filled 2014-05-04: qty 1

## 2014-05-04 MED ORDER — OXYTOCIN 10 UNIT/ML IJ SOLN
INTRAMUSCULAR | Status: AC
  Administered 2014-05-04: 10 [IU]
  Filled 2014-05-04: qty 1

## 2014-05-04 MED ORDER — WITCH HAZEL-GLYCERIN EX PADS: 1.0000 "application " | MEDICATED_PAD | CUTANEOUS | Status: DC | PRN

## 2014-05-04 MED ORDER — BENZOCAINE-MENTHOL 20-0.5 % EX AERO: 1.0000 "application " | INHALATION_SPRAY | CUTANEOUS | Status: DC | PRN

## 2014-05-04 NOTE — Progress Notes (Signed)
Patient ambulated to bathroom, voided and performed peri care.  Tolerated well.

## 2014-05-05 LAB — CBC
HCT: 32 % — ABNORMAL LOW (ref 36.0–46.0)
Hemoglobin: 9.7 g/dL — ABNORMAL LOW (ref 12.0–15.0)
MCH: 20.8 pg — AB (ref 26.0–34.0)
MCHC: 30.3 g/dL (ref 30.0–36.0)
MCV: 68.7 fL — AB (ref 78.0–100.0)
Platelets: 159 10*3/uL (ref 150–400)
RBC: 4.66 MIL/uL (ref 3.87–5.11)
RDW: 16.1 % — AB (ref 11.5–15.5)
WBC: 11.8 10*3/uL — ABNORMAL HIGH (ref 4.0–10.5)

## 2014-05-05 NOTE — Progress Notes (Signed)
PPD #1 Patient is eating, ambulating, voiding.  Pain control is good. Some occasional light headedness. No other complaints. Prefers to stay until tomorrow.  Filed Vitals:   05/04/14 1500 05/04/14 1815 05/04/14 2330 05/05/14 0540  BP: 149/88 138/90 141/93 140/89  Pulse: 89 76 89 74  Temp: 98.4 F (36.9 C) 98.2 F (36.8 C) 98.7 F (37.1 C) 97.9 F (36.6 C)  TempSrc: Oral Oral Oral Oral  Resp: 18 18 18 18   Height:      Weight:        Fundus firm Perineum without swelling. No Ct  Lab Results  Component Value Date   WBC 11.8* 05/05/2014   HGB 9.7* 05/05/2014   HCT 32.0* 05/05/2014   MCV 68.7* 05/05/2014   PLT 159 05/05/2014    B/Positive/-- (12/18 0000)  A/P Post partum day 1. Circ desired, consent obtained  Routine care.  Expect d/c 7/1.    Philip AspenALLAHAN, SIDNEY

## 2014-05-05 NOTE — Progress Notes (Signed)
Patient reports feeling "a little shaky" last time she ambulated to bathroom.  Encouraged her to call for help with next ambulation.

## 2014-05-06 LAB — CBC
HCT: 32 % — ABNORMAL LOW (ref 36.0–46.0)
HEMOGLOBIN: 9.7 g/dL — AB (ref 12.0–15.0)
MCH: 20.9 pg — AB (ref 26.0–34.0)
MCHC: 30.3 g/dL (ref 30.0–36.0)
MCV: 68.8 fL — ABNORMAL LOW (ref 78.0–100.0)
Platelets: 180 10*3/uL (ref 150–400)
RBC: 4.65 MIL/uL (ref 3.87–5.11)
RDW: 16.2 % — ABNORMAL HIGH (ref 11.5–15.5)
WBC: 7.8 10*3/uL (ref 4.0–10.5)

## 2014-05-06 LAB — COMPREHENSIVE METABOLIC PANEL
ALK PHOS: 112 U/L (ref 39–117)
ALT: 12 U/L (ref 0–35)
AST: 23 U/L (ref 0–37)
Albumin: 2.4 g/dL — ABNORMAL LOW (ref 3.5–5.2)
Anion gap: 10 (ref 5–15)
BILIRUBIN TOTAL: 0.2 mg/dL — AB (ref 0.3–1.2)
BUN: 8 mg/dL (ref 6–23)
CHLORIDE: 100 meq/L (ref 96–112)
CO2: 24 mEq/L (ref 19–32)
Calcium: 8.8 mg/dL (ref 8.4–10.5)
Creatinine, Ser: 0.68 mg/dL (ref 0.50–1.10)
GLUCOSE: 86 mg/dL (ref 70–99)
POTASSIUM: 3.9 meq/L (ref 3.7–5.3)
Sodium: 134 mEq/L — ABNORMAL LOW (ref 137–147)
Total Protein: 5.7 g/dL — ABNORMAL LOW (ref 6.0–8.3)

## 2014-05-06 LAB — URIC ACID: Uric Acid, Serum: 7.1 mg/dL — ABNORMAL HIGH (ref 2.4–7.0)

## 2014-05-06 NOTE — Progress Notes (Signed)
PPD#2 Pt has had elevated B/Ps since delivery. She has no symptoms. Lochia- wnl. IMP/ elevated B/P Plan/ Will check [facility] labs.

## 2014-05-06 NOTE — Discharge Summary (Signed)
Obstetric Discharge Summary Reason for Admission: onset of labor Prenatal Procedures: ultrasound Intrapartum Procedures: spontaneous vaginal delivery Postpartum Procedures: none Complications-Operative and Postpartum: none Hemoglobin  Date Value Ref Range Status  05/06/2014 9.7* 12.0 - 15.0 g/dL Final  16/10/960412/18/2014 54.036.8   Final     HCT  Date Value Ref Range Status  05/06/2014 32.0* 36.0 - 46.0 % Final  10/23/2013 10   Final    Physical Exam:  General: alert Lochia: appropriate Uterine Fundus: firm  Discharge Diagnoses: Term Pregnancy-delivered  Discharge Information: Date: 05/06/2014 Activity: pelvic rest Diet: routine Medications: PNV and Ibuprofen Condition: stable Instructions: refer to practice specific booklet Discharge to: home Follow-up Information   Follow up with Levi AlandANDERSON,Allex Lapoint E, MD. Schedule an appointment as soon as possible for a visit in 1 week.   Specialty:  Obstetrics and Gynecology   Contact information:   [address] [place] [id number] [phone]       Newborn Data: Live born female  Birth Weight: 8 lb 4.8 oz (3765 g) APGAR: 9, 9  Home with mother.  Levi AlandNDERSON,Phylicia Mcgaugh E 05/06/2014, 4:38 PM

## 2014-08-22 DIAGNOSIS — I10 Essential (primary) hypertension: Secondary | ICD-10-CM | POA: Diagnosis present

## 2014-09-07 ENCOUNTER — Encounter (HOSPITAL_COMMUNITY): Payer: Self-pay | Admitting: *Deleted

## 2018-09-23 ENCOUNTER — Emergency Department (HOSPITAL_BASED_OUTPATIENT_CLINIC_OR_DEPARTMENT_OTHER): Payer: Self-pay

## 2018-09-23 ENCOUNTER — Emergency Department (HOSPITAL_BASED_OUTPATIENT_CLINIC_OR_DEPARTMENT_OTHER)
Admission: EM | Admit: 2018-09-23 | Discharge: 2018-09-23 | Disposition: A | Discharge status: Home / Self Care | Payer: Self-pay | Attending: Emergency Medicine | Admitting: Emergency Medicine

## 2018-09-23 ENCOUNTER — Encounter (HOSPITAL_BASED_OUTPATIENT_CLINIC_OR_DEPARTMENT_OTHER): Payer: Self-pay | Admitting: *Deleted

## 2018-09-23 ENCOUNTER — Other Ambulatory Visit: Payer: Self-pay

## 2018-09-23 DIAGNOSIS — R0789 Other chest pain: Secondary | ICD-10-CM | POA: Insufficient documentation

## 2018-09-23 DIAGNOSIS — R1013 Epigastric pain: Secondary | ICD-10-CM | POA: Insufficient documentation

## 2018-09-23 DIAGNOSIS — I10 Essential (primary) hypertension: Secondary | ICD-10-CM | POA: Insufficient documentation

## 2018-09-23 DIAGNOSIS — Z79899 Other long term (current) drug therapy: Secondary | ICD-10-CM | POA: Insufficient documentation

## 2018-09-23 LAB — HEPATIC FUNCTION PANEL
ALK PHOS: 86 U/L (ref 38–126)
ALT: 20 U/L (ref 0–44)
AST: 20 U/L (ref 15–41)
Albumin: 4 g/dL (ref 3.5–5.0)
BILIRUBIN TOTAL: 0.4 mg/dL (ref 0.3–1.2)
TOTAL PROTEIN: 7.9 g/dL (ref 6.5–8.1)

## 2018-09-23 LAB — CBC
HCT: 44.3 % (ref 36.0–46.0)
Hemoglobin: 12.8 g/dL (ref 12.0–15.0)
MCH: 22.1 pg — AB (ref 26.0–34.0)
MCHC: 28.9 g/dL — ABNORMAL LOW (ref 30.0–36.0)
MCV: 76.6 fL — ABNORMAL LOW (ref 80.0–100.0)
Platelets: 272 10*3/uL (ref 150–400)
RBC: 5.78 MIL/uL — AB (ref 3.87–5.11)
RDW: 14.3 % (ref 11.5–15.5)
WBC: 6.2 10*3/uL (ref 4.0–10.5)
nRBC: 0 % (ref 0.0–0.2)

## 2018-09-23 LAB — BASIC METABOLIC PANEL
ANION GAP: 8 (ref 5–15)
BUN: 11 mg/dL (ref 6–20)
CO2: 28 mmol/L (ref 22–32)
CREATININE: 0.69 mg/dL (ref 0.44–1.00)
Calcium: 9.3 mg/dL (ref 8.9–10.3)
Chloride: 100 mmol/L (ref 98–111)
GFR calc Af Amer: >60 mL/min (ref >60)
GFR calc non Af Amer: >60 mL/min (ref >60)
GLUCOSE: 117 mg/dL — AB (ref 70–99)
Potassium: 3.7 mmol/L (ref 3.5–5.1)
Sodium: 136 mmol/L (ref 135–145)

## 2018-09-23 LAB — LIPASE, BLOOD: Lipase: 34 U/L (ref 11–51)

## 2018-09-23 LAB — PREGNANCY, URINE: Preg Test, Ur: NEGATIVE

## 2018-09-23 LAB — TROPONIN I

## 2018-09-23 MED ORDER — ALUM & MAG HYDROXIDE-SIMETH 200-200-20 MG/5ML PO SUSP
30.0000 mL | Freq: Once | ORAL | Status: AC
  Administered 2018-09-23: 30 mL via ORAL
  Filled 2018-09-23: qty 30

## 2018-09-23 MED ORDER — ACETAMINOPHEN 325 MG PO TABS
650.0000 mg | ORAL_TABLET | Freq: Once | ORAL | Status: AC
  Administered 2018-09-23: 650 mg via ORAL
  Filled 2018-09-23: qty 2

## 2018-09-23 NOTE — ED Provider Notes (Signed)
[facility] EMERGENCY DEPARTMENT Provider Note   CSN: [id number] Arrival date & time: 09/23/18  1736     History   Chief Complaint Chief Complaint  Patient presents with  . Chest Pain    HPI Stacy Harrell is a 42 y.o. female.  The history is provided by the patient.  Chest Pain   This is a new problem. The current episode started yesterday. Episode frequency: waxing and waning  The problem has been resolved. The pain is associated with movement and raising an arm. The pain is present in the substernal region and epigastric region. The pain is at a severity of 3/10. The pain is mild. The quality of the pain is described as sharp. The pain does not radiate. The symptoms are aggravated by certain positions. Associated symptoms include abdominal pain (epigastric ). Pertinent negatives include no back pain, no claudication, no cough, no fever, no headaches, no hemoptysis, no leg pain, no lower extremity edema, no malaise/fatigue, no nausea, no numbness, no palpitations, no PND, no shortness of breath, no sputum production, no syncope, no vomiting and no weakness.  Pertinent negatives for past medical history include no CAD, no COPD, no DVT, no hyperlipidemia, no hypertension, no PE and no seizures.  Pertinent negatives for family medical history include: no CAD.    Past Medical History:  Diagnosis Date  . Acute cholecystitis s/p lap chole 02/13/2013 02/15/2013  . Back pain   . Cholecystitis   . Hypertension    not currently on meds  . Transaminitis s/p liver Bx Apr2014 02/15/2013    Patient Active Problem List   Diagnosis Date Noted  . Normal delivery 05/04/2014  . Active labor 05/03/2014  . Acute cholecystitis s/p lap chole 02/13/2013 02/15/2013  . Transaminitis s/p liver Bx Apr2014 02/15/2013  . Hypertension     Past Surgical History:  Procedure Laterality Date  . CHOLECYSTECTOMY N/A 02/13/2013   Procedure: LAPAROSCOPIC CHOLECYSTECTOMY WITH INTRAOPERATIVE CHOLANGIOGRAM;   Surgeon: Adolph Pollackodd J Rosenbower, MD;  Location: [facility];  Service: General;  Laterality: N/A;  . LIVER BIOPSY  02/13/2013   Procedure: LIVER BIOPSY;  Surgeon: Adolph Pollackodd J Rosenbower, MD;  Location: [facility];  Service: General;;     OB History    Gravida  3   Para  3   Term  3   Preterm  0   AB  0   Living  3     SAB  0   TAB  0   Ectopic  0   Multiple  0   Live Births  1            Home Medications    Prior to Admission medications   Medication Sig Start Date End Date Taking? Authorizing Provider  Prenatal Vit-Fe Fumarate-FA (PRENATAL MULTIVITAMIN) TABS tablet Take 1 tablet by mouth daily at 12 noon.    [provider]    Family History Family History  Problem Relation Age of Onset  . Hypertension Mother   . Diabetes Father   . Hypertension Father   . Hypertension Sister   . Hypertension Brother     Social History Social History   Tobacco Use  . Smoking status: Never Smoker  . Smokeless tobacco: Never Used  Substance Use Topics  . Alcohol use: No  . Drug use: No     Allergies   Patient has no known allergies.   Review of Systems Review of Systems  Constitutional: Negative for chills, fever and malaise/fatigue.  HENT: Negative  for ear pain and sore throat.   Eyes: Negative for pain and visual disturbance.  Respiratory: Negative for cough, hemoptysis, sputum production and shortness of breath.   Cardiovascular: Positive for chest pain. Negative for palpitations, claudication, syncope and PND.  Gastrointestinal: Positive for abdominal pain (epigastric ). Negative for nausea and vomiting.  Genitourinary: Negative for dysuria and hematuria.  Musculoskeletal: Negative for arthralgias and back pain.  Skin: Negative for color change and rash.  Neurological: Negative for seizures, syncope, weakness, numbness and headaches.  All other systems reviewed and are negative.    Physical Exam Updated Vital Signs  ED Triage Vitals  Enc Vitals Group      BP 09/23/18 1742 (!) 199/100     Pulse Rate 09/23/18 1742 84     Resp 09/23/18 1742 18     Temp 09/23/18 1742 97.9 F (36.6 C)     Temp Source 09/23/18 1742 Oral     SpO2 09/23/18 1742 100 %     Weight 09/23/18 1740 203 lb 0.7 oz (92.1 kg)     Height 09/23/18 1740 5\' 6"  (1.676 m)     Head Circumference --      Peak Flow --      Pain Score 09/23/18 1739 6     Pain Loc --      Pain Edu? --      Excl. in GC? --     Physical Exam  Constitutional: She appears well-developed and well-nourished. No distress.  HENT:  Head: Normocephalic and atraumatic.  Eyes: Pupils are equal, round, and reactive to light. Conjunctivae and EOM are normal.  Neck: Normal range of motion. Neck supple.  Cardiovascular: Normal rate, regular rhythm, intact distal pulses and normal pulses.  No murmur heard. Pulmonary/Chest: Effort normal and breath sounds normal. No respiratory distress. She has no decreased breath sounds. She has no wheezes. She has no rhonchi. She has no rales.  Abdominal: Soft. Bowel sounds are normal. She exhibits no distension. There is tenderness (epigastric region ). There is no rebound and no guarding.  Musculoskeletal: She exhibits no edema.       Right lower leg: She exhibits no edema.       Left lower leg: She exhibits no edema.  TTP to anterior chest wall in sternal area  Neurological: She is alert.  Skin: Skin is warm and dry. Capillary refill takes less than 2 seconds.  Psychiatric: She has a normal mood and affect.  Nursing note and vitals reviewed.    ED Treatments / Results  Labs (all labs ordered are listed, but only abnormal results are displayed) Labs Reviewed  BASIC METABOLIC PANEL - Abnormal; Notable for the following components:      Result Value   Glucose, Bld 117 (*)    All other components within normal limits  CBC - Abnormal; Notable for the following components:   RBC 5.78 (*)    MCV 76.6 (*)    MCH 22.1 (*)    MCHC 28.9 (*)    All other components  within normal limits  TROPONIN I  PREGNANCY, URINE  LIPASE, BLOOD  HEPATIC FUNCTION PANEL    EKG EKG Interpretation  Date/Time:  Monday September 23 2018 17:44:22 EST Ventricular Rate:  76 PR Interval:  156 QRS Duration: 86 QT Interval:  382 QTC Calculation: 429 R Axis:   69 Text Interpretation:  Normal sinus rhythm Moderate voltage criteria for LVH, may be normal variant Borderline ECG Confirmed by Virgina Norfolk [id number]) on 09/23/2018  6:15:55 PM   Radiology Dg Chest 2 View  Result Date: 09/23/2018 CLINICAL DATA:  42 year old female with left chest pain since yesterday under the breast. Shortness of breath and nausea. EXAM: CHEST - 2 VIEW COMPARISON:  Chest radiographs 03/15/2016 and earlier. FINDINGS: Lung volumes and mediastinal contours are within normal limits. Visualized tracheal air column is within normal limits. Both lungs appear clear. No pneumothorax or pleural effusion. Stable cholecystectomy clips. Negative visible bowel gas pattern. No acute osseous abnormality identified. IMPRESSION: Negative.  No cardiopulmonary abnormality. Electronically Signed   By: Odessa Fleming M.D.   On: 09/23/2018 18:43    Procedures Procedures (including critical care time)  Medications Ordered in ED Medications  acetaminophen (TYLENOL) tablet 650 mg (650 mg Oral Given 09/23/18 1846)  alum & mag hydroxide-simeth (MAALOX/MYLANTA) 200-200-20 MG/5ML suspension 30 mL (30 mLs Oral Given 09/23/18 1847)     Initial Impression / Assessment and Plan / ED Course  I have reviewed the triage vital signs and the nursing notes.  Pertinent labs & imaging results that were available during my care of the patient were reviewed by me and considered in my medical decision making (see chart for details).     Brya Simerly is a 42 year old female history of hypertension who presents to the ED with chest pain, epigastric abdominal pain.  Patient with normal vitals except for mild hypertension.  Hypertension improved  upon my evaluation.  No fever.  Patient with intermittent chest pain/epigastric abdominal pain for the last 2 days intermittently.  Patient states no diaphoresis, no nausea, no vomiting, no radiation of pain.  Possible association with food.  Patient states she thought she might of pulled a muscle yesterday as well.  Patient has reproducible chest pain on exam over the sternum.  Has some mild epigastric abdominal pain as well.  EKG shows sinus rhythm with no signs of ischemic changes.  Patient with negative troponin.  Given the length of symptoms doubt ACS as troponin is normal and EKG is reassuring.  Patient with low heart score. PERC negative and doubt PE. Patient with no significant anemia, electrolyte abnormality, kidney injury.  Lipase within normal limits.  Gallbladder and liver enzymes within normal limits.  Suspect patient likely with musculoskeletal source for pain or gastritis.  Recommend Tylenol at home.  Recommend close follow-up with primary care doctor and discharged from ED in good condition.  This chart was dictated using voice recognition software.  Despite best efforts to proofread,  errors can occur which can change the documentation meaning.   Final Clinical Impressions(s) / ED Diagnoses   Final diagnoses:  Atypical chest pain    ED Discharge Orders    None       Virgina Norfolk, DO 09/23/18 2026

## 2018-09-23 NOTE — ED Notes (Signed)
Pt ambulatory to bathroom. Gait equal and balanced.

## 2018-09-23 NOTE — ED Triage Notes (Signed)
Pain under her left breast since yesterday evening.

## 2018-09-23 NOTE — Discharge Instructions (Addendum)
Continue tylenol and motrin for pain.

## 2018-12-27 DIAGNOSIS — Z975 Presence of (intrauterine) contraceptive device: Secondary | ICD-10-CM | POA: Insufficient documentation

## 2019-04-22 IMAGING — DX DG CHEST 2V
2 series · 2 of 2 positions shown · non-contrast
Comparison: Chest radiographs 03/15/2016 and earlier.

CLINICAL DATA: 42-year-old female with left chest pain since
yesterday under the breast. Shortness of breath and nausea.

EXAM:
CHEST - 2 VIEW

[chest pa]
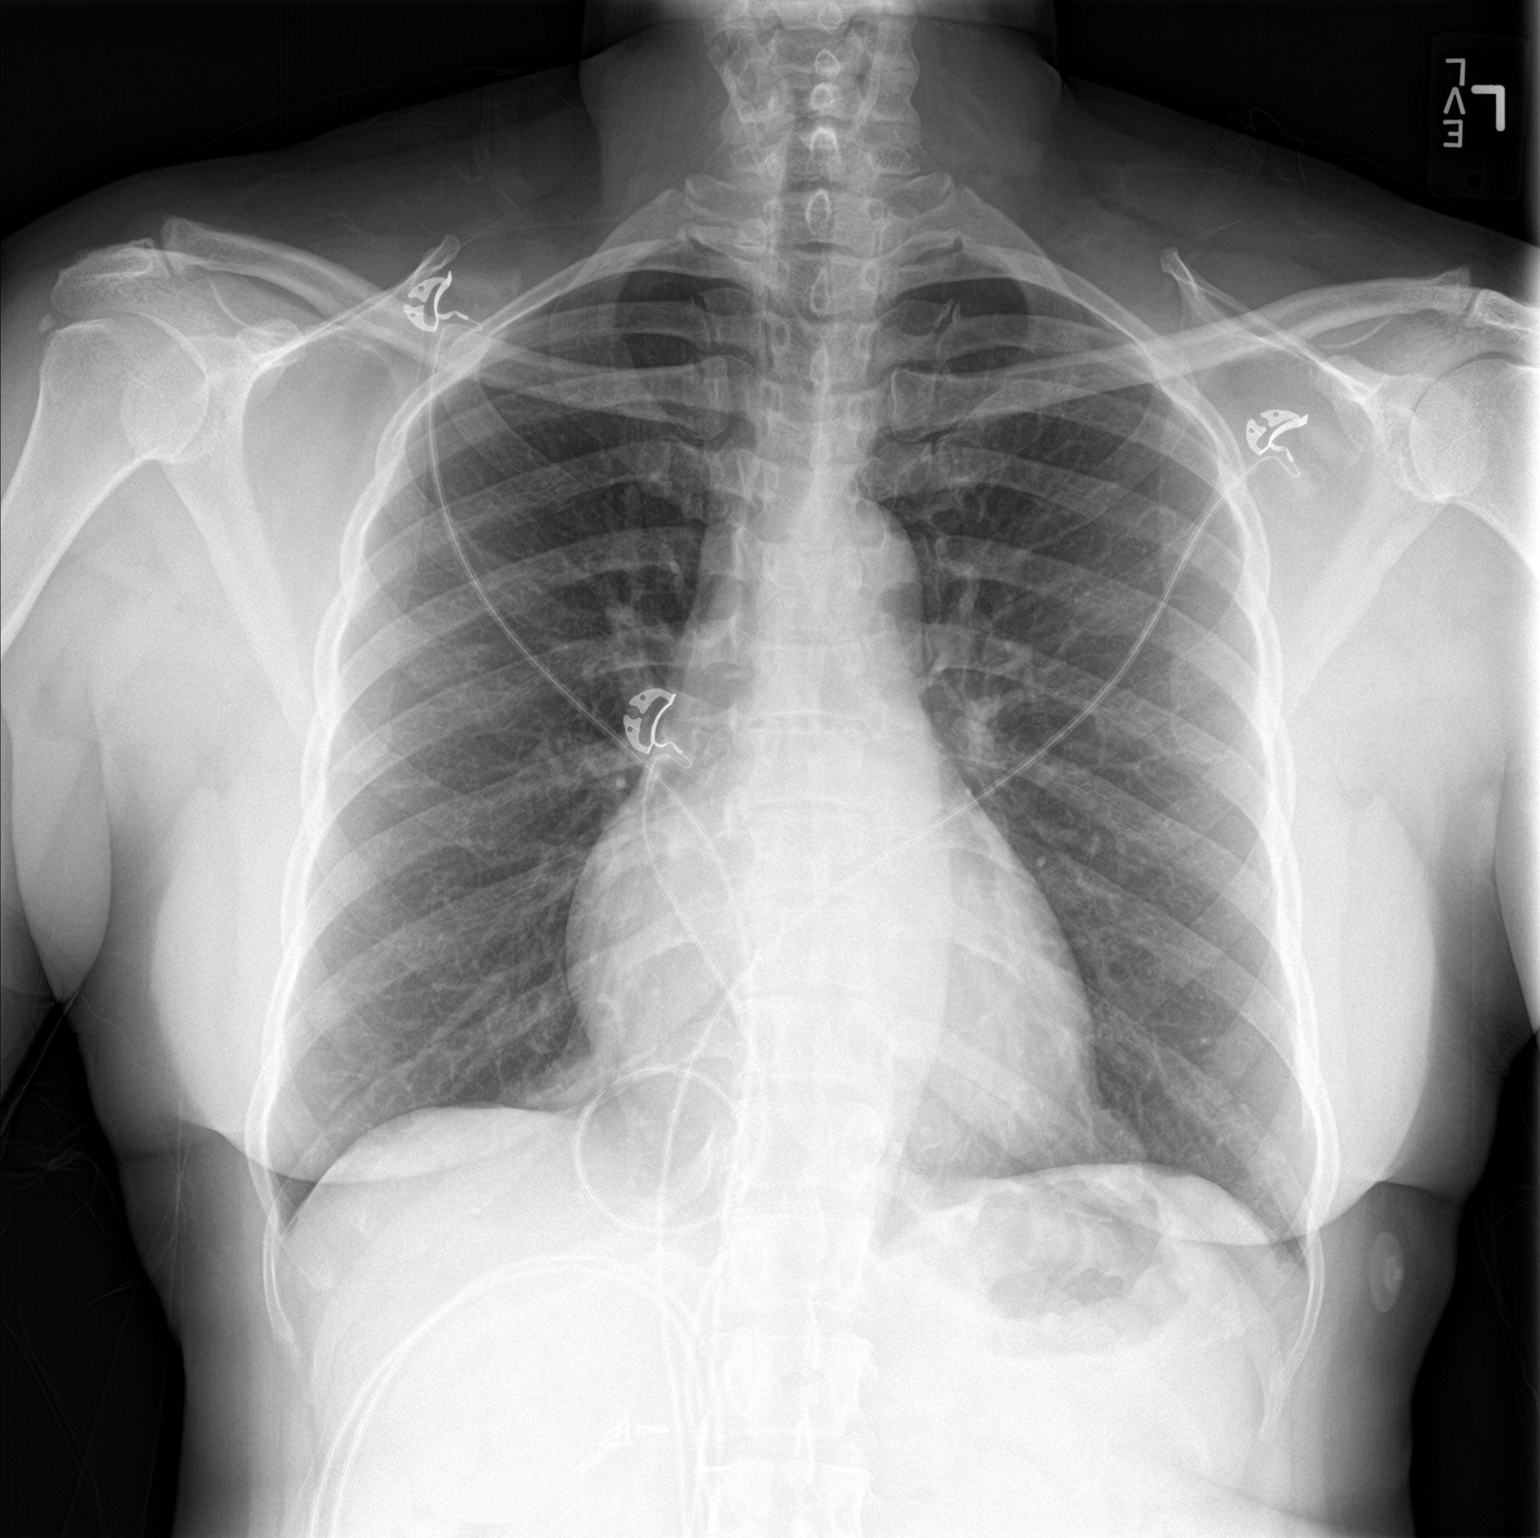

[chest lat]
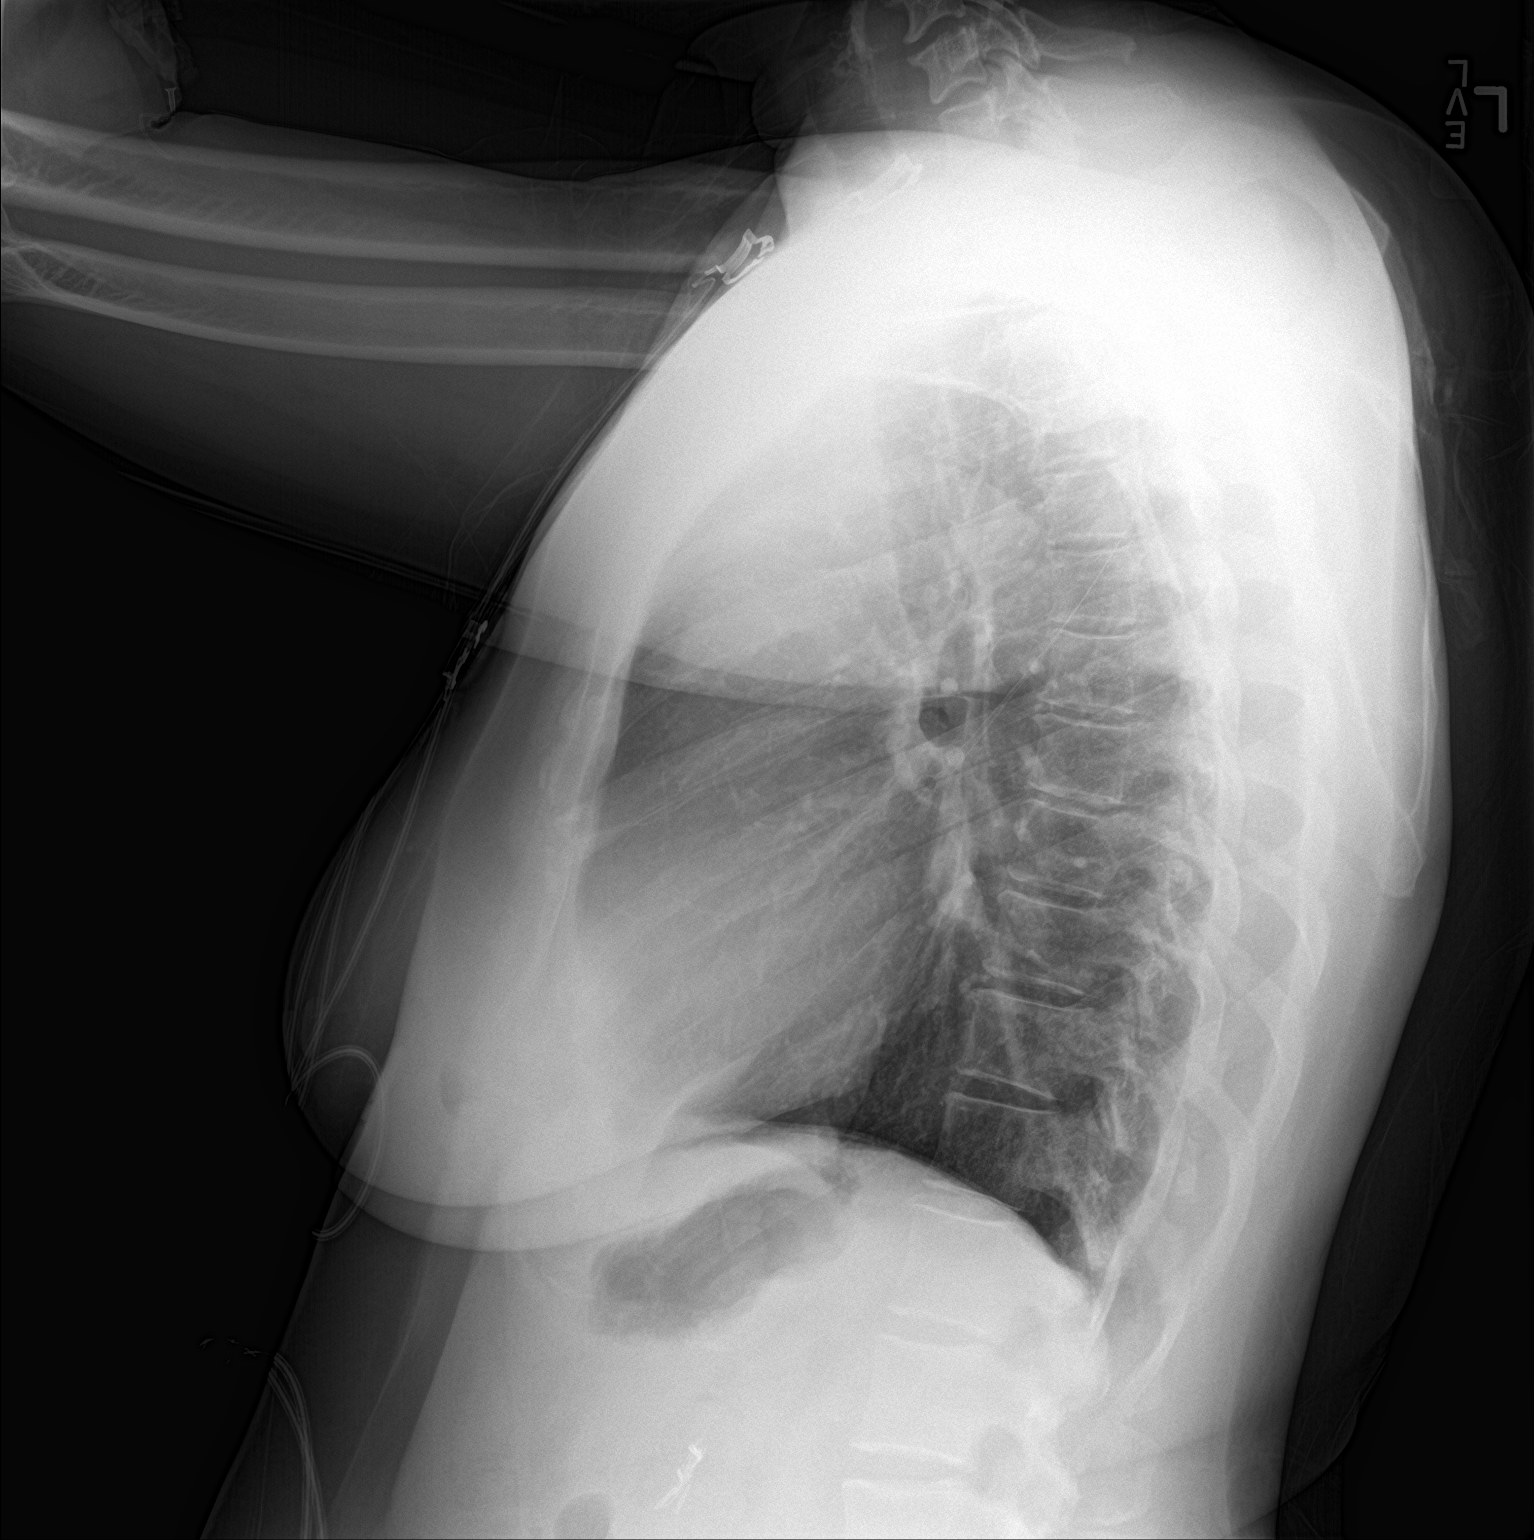

[2 of 2 positions shown; findings below may reference images not displayed]

FINDINGS: Lung volumes and mediastinal contours are within normal limits.
Visualized tracheal air column is within normal limits. Both lungs
appear clear. No pneumothorax or pleural effusion. Stable
cholecystectomy clips. Negative visible bowel gas pattern. No acute
osseous abnormality identified.
IMPRESSION: Negative.  No cardiopulmonary abnormality.

## 2020-03-16 DIAGNOSIS — N939 Abnormal uterine and vaginal bleeding, unspecified: Secondary | ICD-10-CM | POA: Insufficient documentation

## 2020-03-19 LAB — HM PAP SMEAR

## 2020-07-05 ENCOUNTER — Ambulatory Visit: Payer: 59 | Admitting: Family Medicine

## 2020-07-05 ENCOUNTER — Encounter: Payer: Self-pay | Admitting: Family Medicine

## 2020-07-05 ENCOUNTER — Other Ambulatory Visit: Payer: Self-pay

## 2020-07-05 VITALS — BP 114/70 | HR 69 | Temp 97.3°F | Resp 16 | Ht 66.0 in | Wt 197.0 lb

## 2020-07-05 DIAGNOSIS — I10 Essential (primary) hypertension: Secondary | ICD-10-CM

## 2020-07-05 DIAGNOSIS — E669 Obesity, unspecified: Secondary | ICD-10-CM | POA: Diagnosis not present

## 2020-07-05 DIAGNOSIS — Z9049 Acquired absence of other specified parts of digestive tract: Secondary | ICD-10-CM | POA: Diagnosis not present

## 2020-07-05 DIAGNOSIS — Z7689 Persons encountering health services in other specified circumstances: Secondary | ICD-10-CM

## 2020-07-05 DIAGNOSIS — E66811 Obesity, class 1: Secondary | ICD-10-CM

## 2020-07-05 NOTE — Assessment & Plan Note (Signed)
Encourage lifestyle diet exercise 

## 2020-07-05 NOTE — Assessment & Plan Note (Addendum)
Well-controlled HTN - Home BP readings reviewed  No known complications  Past history of Gestational HTN w/ last pregnancy    Plan:  1. Continue current BP regimen Lisinopril-HCTZ 20-12.5mg  x 2 pills daily (total dose 40-25mg ) - not due for re order yet. She can notify our office when ready for refill 2. Encourage improved lifestyle - low sodium diet, regular exercise 3. Continue monitor BP outside office, bring readings to next visit, if persistently >140/90 or new symptoms notify office sooner

## 2020-07-05 NOTE — Progress Notes (Signed)
Subjective:    Patient ID: Stacy Harrell, female    DOB: Oct 27, 1976, 44 y.o.   MRN: [id number]  Stacy Harrell is a 44 y.o. female presenting on 07/05/2020 for Establish Care (HTN)  Previously followed by [facility] internist. She lives and works near [employer] [place], she has relocated due to insurance change. Local [facility] in her area was not accepting new patients until 2022.  HPI   CHRONIC HTN: Reports prior history HTN for past >6 years, secondary to prior most recent pregnancy, had complication with HTN, and has had secondary HTN after the pregnancy was completed. - She checks BP at home on arm BP cuff Current Meds - Lisinopril-HCTZ 20-12.5mg  x 2 daily - has refills currently Reports good compliance, took meds today. Tolerating well, w/o complaints. Lifestyle: - Diet: limit salt in diet - Exercise: regular exercise Denies CP, dyspnea, HA, edema, dizziness / lightheadedness  History Abdominal Pain episodic History of constipation S/p Cholecystitis and cholecystectomy in 2014 Previously on GERD treatment PPI pantoprazole now off med  Health Maintenance: Last pap smear done by Dr Stacy Aspen DO [facility]) done in 03/2020 approximately advised repeat yearly, will fax request record.  Not up to date yet on COVID vaccine.  Depression screen PHQ 2/9 07/05/2020  Decreased Interest 0  Down, Depressed, Hopeless 0  PHQ - 2 Score 0    Past Medical History:  Diagnosis Date  . Acute cholecystitis s/p lap chole 02/13/2013 02/15/2013  . Back pain   . Cholecystitis   . Hypertension    not currently on meds  . Transaminitis s/p liver Bx Apr2014 02/15/2013   Past Surgical History:  Procedure Laterality Date  . CHOLECYSTECTOMY N/A 02/13/2013   Procedure: LAPAROSCOPIC CHOLECYSTECTOMY WITH INTRAOPERATIVE CHOLANGIOGRAM;  Surgeon: Adolph Pollack, MD;  Location: [facility];  Service: General;  Laterality: N/A;  . LIVER BIOPSY  02/13/2013   Procedure: LIVER BIOPSY;  Surgeon:  Adolph Pollack, MD;  Location: [facility];  Service: General;;   Social History   Socioeconomic History  . Marital status: Married    Spouse name: Not on file  . Number of children: 2  . Years of education: Not on file  . Highest education level: Not on file  Occupational History  . Not on file  Tobacco Use  . Smoking status: Never Smoker  . Smokeless tobacco: Never Used  Substance and Sexual Activity  . Alcohol use: No  . Drug use: No  . Sexual activity: Never  Other Topics Concern  . Not on file  Social History Narrative  . Not on file   Social Determinants of Health   Financial Resource Strain:   . Difficulty of Paying Living Expenses: Not on file  Food Insecurity:   . Worried About Programme researcher, broadcasting/film/video in the Last Year: Not on file  . Ran Out of Food in the Last Year: Not on file  Transportation Needs:   . Lack of Transportation (Medical): Not on file  . Lack of Transportation (Non-Medical): Not on file  Physical Activity:   . Days of Exercise per Week: Not on file  . Minutes of Exercise per Session: Not on file  Stress:   . Feeling of Stress : Not on file  Social Connections:   . Frequency of Communication with Friends and Family: Not on file  . Frequency of Social Gatherings with Friends and Family: Not on file  . Attends Religious Services: Not on file  . Active  Member of Clubs or Organizations: Not on file  . Attends Banker Meetings: Not on file  . Marital Status: Not on file  Intimate Partner Violence:   . Fear of Current or Ex-Partner: Not on file  . Emotionally Abused: Not on file  . Physically Abused: Not on file  . Sexually Abused: Not on file   Family History  Problem Relation Age of Onset  . Hypertension Mother   . Diabetes Father   . Hypertension Father   . Hypertension Sister   . Hypertension Brother    Current Outpatient Medications on File Prior to Visit  Medication Sig  . lisinopril-hydrochlorothiazide (ZESTORETIC) 20-12.5  MG tablet Take 2 tablets by mouth daily.  . Prenatal Vit-Fe Fumarate-FA (PRENATAL MULTIVITAMIN) TABS tablet Take 1 tablet by mouth daily at 12 noon.  . triamcinolone ointment (KENALOG) 0.1 % Apply topically 2 (two) times daily.   No current facility-administered medications on file prior to visit.    Review of Systems Per HPI unless specifically indicated above      Objective:    BP 114/70   Pulse 69   Temp (!) 97.3 F (36.3 C) (Temporal)   Resp 16   Ht 5\' 6"  (1.676 m)   Wt 197 lb (89.4 kg)   SpO2 100%   BMI 31.80 kg/m   Wt Readings from Last 3 Encounters:  07/05/20 197 lb (89.4 kg)  09/23/18 203 lb 0.7 oz (92.1 kg)  05/03/14 203 lb (92.1 kg)    Physical Exam Vitals and nursing note reviewed.  Constitutional:      General: She is not in acute distress.    Appearance: She is well-developed. She is not diaphoretic.     Comments: Well-appearing, comfortable, cooperative  HENT:     Head: Normocephalic and atraumatic.  Eyes:     General:        Right eye: No discharge.        Left eye: No discharge.     Conjunctiva/sclera: Conjunctivae normal.  Neck:     Thyroid: No thyromegaly.  Cardiovascular:     Rate and Rhythm: Normal rate and regular rhythm.     Heart sounds: Normal heart sounds. No murmur heard.   Pulmonary:     Effort: Pulmonary effort is normal. No respiratory distress.     Breath sounds: Normal breath sounds. No wheezing or rales.  Musculoskeletal:        General: Normal range of motion.     Cervical back: Normal range of motion and neck supple.     Right lower leg: No edema.     Left lower leg: No edema.  Lymphadenopathy:     Cervical: No cervical adenopathy.  Skin:    General: Skin is warm and dry.     Findings: No erythema or rash.  Neurological:     Mental Status: She is alert and oriented to person, place, and time.  Psychiatric:        Behavior: Behavior normal.     Comments: Well groomed, good eye contact, normal speech and thoughts     Results for orders placed or performed during the hospital encounter of 09/23/18  Basic metabolic panel  Result Value Ref Range   Sodium 136 135 - 145 mmol/L   Potassium 3.7 3.5 - 5.1 mmol/L   Chloride 100 98 - 111 mmol/L   CO2 28 22 - 32 mmol/L   Glucose, Bld 117 (H) 70 - 99 mg/dL   BUN 11 6 -  20 mg/dL   Creatinine, Ser 5.42 0.44 - 1.00 mg/dL   Calcium 9.3 8.9 - 70.6 mg/dL   GFR calc non Af Amer >60 >60 mL/min   GFR calc Af Amer >60 >60 mL/min   Anion gap 8 5 - 15  CBC  Result Value Ref Range   WBC 6.2 4.0 - 10.5 K/uL   RBC 5.78 (H) 3.87 - 5.11 MIL/uL   Hemoglobin 12.8 12.0 - 15.0 g/dL   HCT 23.7 36 - 46 %   MCV 76.6 (L) 80.0 - 100.0 fL   MCH 22.1 (L) 26.0 - 34.0 pg   MCHC 28.9 (L) 30.0 - 36.0 g/dL   RDW 62.8 31.5 - 17.6 %   Platelets 272 150 - 400 K/uL   nRBC 0.0 0.0 - 0.2 %  Troponin I - ONCE - STAT  Result Value Ref Range   Troponin I <0.03 <0.03 ng/mL  Pregnancy, urine  Result Value Ref Range   Preg Test, Ur NEGATIVE NEGATIVE  Lipase, blood  Result Value Ref Range   Lipase 34 11 - 51 U/L  Hepatic function panel  Result Value Ref Range   Total Protein 7.9 6.5 - 8.1 g/dL   Albumin 4.0 3.5 - 5.0 g/dL   AST 20 15 - 41 U/L   ALT 20 0 - 44 U/L   Alkaline Phosphatase 86 38 - 126 U/L   Total Bilirubin 0.4 0.3 - 1.2 mg/dL   Bilirubin, Direct <1.6 0.0 - 0.2 mg/dL   Indirect Bilirubin NOT CALCULATED 0.3 - 0.9 mg/dL      Assessment & Plan:   Problem List Items Addressed This Visit    S/P laparoscopic cholecystectomy   Obesity (BMI 30.0-34.9)    Encourage lifestyle diet exercise      Essential hypertension - Primary    Well-controlled HTN - Home BP readings reviewed  No known complications  Past history of Gestational HTN w/ last pregnancy    Plan:  1. Continue current BP regimen Lisinopril-HCTZ 20-12.5mg  x 2 pills daily (total dose 40-25mg ) - not due for re order yet. She can notify our office when ready for refill 2. Encourage improved lifestyle - low  sodium diet, regular exercise 3. Continue monitor BP outside office, bring readings to next visit, if persistently >140/90 or new symptoms notify office sooner      Relevant Medications   lisinopril-hydrochlorothiazide (ZESTORETIC) 20-12.5 MG tablet    Other Visit Diagnoses    Encounter to establish care with new doctor          Review outside records in EHR from prior PCP [facility].  Request copy of last record from [facility] pap smear 03/2020, Dr Stacy Harrell    No orders of the defined types were placed in this encounter.     Follow up plan: Return in about 6 months (around 01/03/2021) for 6 months HTN, med refills.  Note - she lives and works in [place] - she may plan to switch to other [place] PCP near her home if they accept new patients in 2022. Advised that this is fine, she can follow with [place] as long as needed, or if transfer, she should notify the office.  Saralyn Pilar, DO [facility] 07/05/2020, 10:27 AM

## 2020-07-05 NOTE — Patient Instructions (Addendum)
Thank you for coming to the office today.  Let me know - call the office if you need me to re order your blood pressure pill.  Or if you want to do a referral or consultation with a GI stomach doctor  If you plan to switch to doctor closer to [facility] [place] you may go ahead and switch when you are ready in 2022.  Flu shot later in 2021.   Please schedule a Follow-up Appointment to: Return in about 6 months (around 01/03/2021) for 6 months HTN, med refills.  If you have any other questions or concerns, please feel free to call the office or send a message through MyChart. You may also schedule an earlier appointment if necessary.  Additionally, you may be receiving a survey about your experience at our office within a few days to 1 week by e-mail or mail. We value your feedback.  Saralyn Pilar, DO [facility], [place]

## 2020-07-06 ENCOUNTER — Encounter: Payer: Self-pay | Admitting: Family Medicine

## 2021-01-03 ENCOUNTER — Ambulatory Visit: Payer: 59 | Admitting: Family Medicine
